# Patient Record
Sex: Female | Born: 1955
Health system: Southern US, Community
[De-identification: ages and names within clinical notes are randomized; demographics above are authoritative.]

## PROBLEM LIST (undated history)

## (undated) DIAGNOSIS — A499 Bacterial infection, unspecified: Secondary | ICD-10-CM

## (undated) DIAGNOSIS — B019 Varicella without complication: Secondary | ICD-10-CM

## (undated) DIAGNOSIS — D649 Anemia, unspecified: Secondary | ICD-10-CM

## (undated) DIAGNOSIS — N390 Urinary tract infection, site not specified: Secondary | ICD-10-CM

## (undated) HISTORY — DX: Bacterial infection, unspecified: A49.9

## (undated) HISTORY — DX: Urinary tract infection, site not specified: N39.0

## (undated) HISTORY — DX: Anemia, unspecified: D64.9

## (undated) HISTORY — DX: Varicella without complication: B01.9

---
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
## 2007-11-03 LAB — IFOBT (OCCULT BLOOD): IFOBT: NEGATIVE

## 2008-11-09 LAB — IFOBT (OCCULT BLOOD): IFOBT: NEGATIVE

## 2010-11-21 LAB — IFOBT (OCCULT BLOOD): IFOBT: NEGATIVE

## 2010-12-13 LAB — HM MAMMOGRAPHY

## 2011-11-25 LAB — HM PAP SMEAR: HM Pap smear: NORMAL

## 2012-09-12 ENCOUNTER — Ambulatory Visit (INDEPENDENT_AMBULATORY_CARE_PROVIDER_SITE_OTHER): Payer: BC Managed Care – PPO | Admitting: Nurse Practitioner

## 2012-09-12 ENCOUNTER — Encounter: Payer: Self-pay | Admitting: Nurse Practitioner

## 2012-09-12 VITALS — BP 104/58 | HR 80 | Temp 98.3°F | Ht 66.5 in | Wt 140.2 lb

## 2012-09-12 DIAGNOSIS — Z13220 Encounter for screening for lipoid disorders: Secondary | ICD-10-CM

## 2012-09-12 DIAGNOSIS — Z130 Encounter for screening for diseases of the blood and blood-forming organs and certain disorders involving the immune mechanism: Secondary | ICD-10-CM

## 2012-09-12 DIAGNOSIS — Z1211 Encounter for screening for malignant neoplasm of colon: Secondary | ICD-10-CM

## 2012-09-12 DIAGNOSIS — Z1321 Encounter for screening for nutritional disorder: Secondary | ICD-10-CM

## 2012-09-12 DIAGNOSIS — Z23 Encounter for immunization: Secondary | ICD-10-CM

## 2012-09-12 LAB — LIPID PANEL
Cholesterol: 249 mg/dL — ABNORMAL HIGH (ref 0–200)
Total CHOL/HDL Ratio: 3
VLDL: 19.6 mg/dL (ref 0.0–40.0)

## 2012-09-12 MED ORDER — TETANUS-DIPHTH-ACELL PERTUSSIS 5-2.5-18.5 LF-MCG/0.5 IM SUSP
0.5000 mL | Freq: Once | INTRAMUSCULAR | Status: AC
  Administered 2012-09-12: 0.5 mL via INTRAMUSCULAR

## 2012-09-12 NOTE — Patient Instructions (Addendum)
You look great today! I recommend 81 mg enteric coated aspirin daily for stroke prevention. Also continue with daily fish oil. I will call you if any labs are abnormal, requiring treatment. Sign up for "[facility]" so you can look at labs at home & send emails to me. Unless you are sick, we will see you in 1 year. Please schedule nurse visit for flu vaccine in September. Pleasure to meet you!

## 2012-09-12 NOTE — Progress Notes (Signed)
Subjective:     Sheryl Morrison is a 57 y.o. female and is here for a comprehensive physical exam. The patient reports occasional constipation, managed with metamucil; has received yearly PAP, mammograms, & colon screens-guiac with normal results..  History   Social History  . Marital Status: Married    Spouse Name: N/A    Number of Children: 1  . Years of Education: 12   Occupational History  . Not on file.   Social History Main Topics  . Smoking status: Former Games developer  . Smokeless tobacco: Never Used  . Alcohol Use: No  . Drug Use: No  . Sexually Active: Not on file   Other Topics Concern  . Not on file   Social History Narrative  . No narrative on file   No health maintenance topics applied.  The following portions of the patient's history were reviewed and updated as appropriate: allergies, current medications, past family history, past medical history, past social history, past surgical history and problem list.  Review of Systems A comprehensive review of systems was negative except for: Eyes: positive for contacts/glasses Gastrointestinal: positive for constipation Behavioral/Psych: positive for anxiety , states "nerves" cause constipation at times.  Objective:    BP 104/58  Pulse 80  Temp(Src) 98.3 F (36.8 C) (Oral)  Ht 5' 6.5" (1.689 m)  Wt 140 lb 4 oz (63.617 kg)  BMI 22.3 kg/m2  SpO2 97% General appearance: alert, cooperative, appears stated age and no distress Head: Normocephalic, without obvious abnormality, atraumatic Eyes: conjunctivae/corneas clear. PERRL, EOM's intact. Fundi benign. Ears: external & canal N bilat. Clear effusion R TM, L TM N Nose: Nares normal. Septum midline. Mucosa normal. No drainage or sinus tenderness. Throat: lips, mucosa, and tongue normal; teeth and gums normal Neck: no adenopathy, no carotid bruit, no JVD, supple, symmetrical, trachea midline and thyroid not enlarged, symmetric, no tenderness/mass/nodules Back: symmetric, no  curvature. ROM normal. No CVA tenderness. Lungs: clear to auscultation bilaterally Heart: regular rate and rhythm, S1, S2 normal, no murmur, click, rub or gallop Abdomen: soft, non-tender; bowel sounds normal; no masses,  no organomegaly Extremities: extremities normal, atraumatic, no cyanosis or edema Pulses: 2+ and symmetric Skin: Skin color, texture, turgor normal. No rashes or lesions Lymph nodes: Cervical, supraclavicular, and axillary nodes normal.    Assessment:   Need for diphtheria-tetanus-pertussis (Tdap) vaccine, adult/adolescent - Plan: Tdap vaccine greater than or equal to 7yo IM  Screening cholesterol level - Plan: Lipid panel  Encounter for vitamin deficiency screening - Plan: Vitamin D 25 hydroxy  Colon cancer screening - Plan: Fecal occult blood, imunochemical  Occasional constipation   Plan:   1. Tdap administered today 2.Lipid panel today. Pt NOT fasting. 3. Taking Ca supplements, screen for therapeutic Vit D level for best Ca absorption 4. Ifob sent w/pt today See After Visit Summary for Counseling Recommendations

## 2012-09-13 LAB — LDL CHOLESTEROL, DIRECT: Direct LDL: 156.4 mg/dL

## 2012-09-13 NOTE — Addendum Note (Signed)
Addended by: Baldemar Lenis R on: 09/13/2012 02:08 PM   Modules accepted: Orders

## 2012-09-18 ENCOUNTER — Other Ambulatory Visit (INDEPENDENT_AMBULATORY_CARE_PROVIDER_SITE_OTHER): Payer: BC Managed Care – PPO

## 2012-09-18 DIAGNOSIS — Z1211 Encounter for screening for malignant neoplasm of colon: Secondary | ICD-10-CM

## 2012-09-18 LAB — FECAL OCCULT BLOOD, IMMUNOCHEMICAL: Fecal Occult Bld: NEGATIVE

## 2012-09-19 ENCOUNTER — Encounter: Payer: Self-pay | Admitting: *Deleted

## 2012-09-19 LAB — OCCULT BLOOD X 1 CARD TO LAB, STOOL: Occult Blood (~~LOC~~): NEGATIVE

## 2012-09-19 LAB — CYTOLOGY - PAP
Pap Smear: NEGATIVE
Pap: NEGATIVE

## 2012-10-15 ENCOUNTER — Encounter: Payer: Self-pay | Admitting: Nurse Practitioner

## 2013-09-12 ENCOUNTER — Encounter: Payer: Self-pay | Admitting: Internal Medicine

## 2013-09-12 ENCOUNTER — Ambulatory Visit (INDEPENDENT_AMBULATORY_CARE_PROVIDER_SITE_OTHER): Payer: BC Managed Care – PPO | Admitting: Nurse Practitioner

## 2013-09-12 ENCOUNTER — Encounter: Payer: Self-pay | Admitting: Nurse Practitioner

## 2013-09-12 VITALS — BP 107/68 | HR 72 | Temp 97.4°F | Ht 66.5 in | Wt 140.0 lb

## 2013-09-12 DIAGNOSIS — Z1211 Encounter for screening for malignant neoplasm of colon: Secondary | ICD-10-CM

## 2013-09-12 DIAGNOSIS — Z1239 Encounter for other screening for malignant neoplasm of breast: Secondary | ICD-10-CM

## 2013-09-12 DIAGNOSIS — Z0000 Encounter for general adult medical examination without abnormal findings: Secondary | ICD-10-CM

## 2013-09-12 LAB — CBC WITH DIFFERENTIAL/PLATELET
BASOS ABS: 0 10*3/uL (ref 0.0–0.1)
Basophils Relative: 0.3 % (ref 0.0–3.0)
Eosinophils Absolute: 0.2 10*3/uL (ref 0.0–0.7)
Eosinophils Relative: 3.3 % (ref 0.0–5.0)
HEMATOCRIT: 37.9 % (ref 36.0–46.0)
Hemoglobin: 13 g/dL (ref 12.0–15.0)
LYMPHS ABS: 1.3 10*3/uL (ref 0.7–4.0)
Lymphocytes Relative: 27.5 % (ref 12.0–46.0)
MCHC: 34.2 g/dL (ref 30.0–36.0)
MCV: 88.5 fl (ref 78.0–100.0)
MONO ABS: 0.4 10*3/uL (ref 0.1–1.0)
Monocytes Relative: 7.6 % (ref 3.0–12.0)
Neutro Abs: 3 10*3/uL (ref 1.4–7.7)
Neutrophils Relative %: 61.3 % (ref 43.0–77.0)
PLATELETS: 295 10*3/uL (ref 150.0–400.0)
RBC: 4.29 Mil/uL (ref 3.87–5.11)
RDW: 13.8 % (ref 11.5–15.5)
WBC: 4.9 10*3/uL (ref 4.0–10.5)

## 2013-09-12 LAB — COMPREHENSIVE METABOLIC PANEL
ALK PHOS: 64 U/L (ref 39–117)
ALT: 16 U/L (ref 0–35)
AST: 28 U/L (ref 0–37)
Albumin: 4.4 g/dL (ref 3.5–5.2)
BILIRUBIN TOTAL: 1.1 mg/dL (ref 0.2–1.2)
BUN: 12 mg/dL (ref 6–23)
CO2: 30 mEq/L (ref 19–32)
Calcium: 10 mg/dL (ref 8.4–10.5)
Chloride: 103 mEq/L (ref 96–112)
Creatinine, Ser: 0.8 mg/dL (ref 0.4–1.2)
GFR: 81.5 mL/min (ref >60.00)
GLUCOSE: 85 mg/dL (ref 70–99)
Potassium: 4.3 mEq/L (ref 3.5–5.1)
SODIUM: 140 meq/L (ref 135–145)
TOTAL PROTEIN: 7.2 g/dL (ref 6.0–8.3)

## 2013-09-12 LAB — URINALYSIS, MICROSCOPIC ONLY

## 2013-09-12 LAB — LIPID PANEL
CHOLESTEROL: 278 mg/dL — AB (ref 0–200)
HDL: 84.1 mg/dL (ref >39.00)
LDL CALC: 178 mg/dL — AB (ref 0–99)
Total CHOL/HDL Ratio: 3
Triglycerides: 78 mg/dL (ref 0.0–149.0)
VLDL: 15.6 mg/dL (ref 0.0–40.0)

## 2013-09-12 NOTE — Progress Notes (Signed)
Pre visit review using our clinic review tool, if applicable. No additional management support is needed unless otherwise documented below in the visit note. 

## 2013-09-12 NOTE — Patient Instructions (Signed)
Our office will call you with lab results and any necessary follow up. Great work with regular exercise! The benefits include weight loss, lower risk for heart disease, diabetes, stroke, high blood pressure, lower rates of depression & dementia, better sleep quality & bone health. Eat more plant foods than animal foods, including dairy, for protection against heart disease and cancer. Please get mammogram and colonoscopy. You will need Pap & pelvic exam next year. You may have that done here or with gynecology-I go to [facility]. Pleasure to meet you!  Preventive Care for Adults, Female A healthy lifestyle and preventive care can promote health and wellness. Preventive health guidelines for women include the following key practices.  A routine yearly physical is a good way to check with your caregiver about your health and preventive screening. It is a chance to share any concerns and updates on your health, and to receive a thorough exam.  Visit your dentist for a routine exam and preventive care every 6 months. Brush your teeth twice a day and floss once a day. Good oral hygiene prevents tooth decay and gum disease.  The frequency of eye exams is based on your age, health, family medical history, use of contact lenses, and other factors. Follow your caregiver's recommendations for frequency of eye exams.  Eat a healthy diet. Foods like vegetables, fruits, whole grains, low-fat dairy products, and lean protein foods contain the nutrients you need without too many calories. Decrease your intake of foods high in solid fats, added sugars, and salt. Eat the right amount of calories for you.Get information about a proper diet from your caregiver, if necessary.  Regular physical exercise is one of the most important things you can do for your health. Most adults should get at least 150 minutes of moderate-intensity exercise (any activity that increases your heart rate and causes you to sweat) each  week. In addition, most adults need muscle-strengthening exercises on 2 or more days a week.  Maintain a healthy weight. The body mass index (BMI) is a screening tool to identify possible weight problems. It provides an estimate of body fat based on height and weight. Your caregiver can help determine your BMI, and can help you achieve or maintain a healthy weight.For adults 20 years and older:  A BMI below 18.5 is considered underweight.  A BMI of 18.5 to 24.9 is normal.  A BMI of 25 to 29.9 is considered overweight.  A BMI of 30 and above is considered obese.  Maintain normal blood lipids and cholesterol levels by exercising and minimizing your intake of saturated fat. Eat a balanced diet with plenty of fruit and vegetables. Blood tests for lipids and cholesterol should begin at age 32 and be repeated every 5 years. If your lipid or cholesterol levels are high, you are over 50, or you are at high risk for heart disease, you may need your cholesterol levels checked more frequently.Ongoing high lipid and cholesterol levels should be treated with medicines if diet and exercise are not effective.  If you smoke, find out from your caregiver how to quit. If you do not use tobacco, do not start.  Lung cancer screening is recommended for adults aged 86 80 years who are at high risk for developing lung cancer because of a history of smoking. Yearly low-dose computed tomography (CT) is recommended for people who have at least a 30-pack-year history of smoking and are a current smoker or have quit within the past 15 years. A pack  year of smoking is smoking an average of 1 pack of cigarettes a day for 1 year (for example: 1 pack a day for 30 years or 2 packs a day for 15 years). Yearly screening should continue until the smoker has stopped smoking for at least 15 years. Yearly screening should also be stopped for people who develop a health problem that would prevent them from having lung cancer  treatment.  If you are pregnant, do not drink alcohol. If you are breastfeeding, be very cautious about drinking alcohol. If you are not pregnant and choose to drink alcohol, do not exceed 1 drink per day. One drink is considered to be 12 ounces (355 mL) of beer, 5 ounces (148 mL) of wine, or 1.5 ounces (44 mL) of liquor.  Avoid use of street drugs. Do not share needles with anyone. Ask for help if you need support or instructions about stopping the use of drugs.  High blood pressure causes heart disease and increases the risk of stroke. Your blood pressure should be checked at least every 1 to 2 years. Ongoing high blood pressure should be treated with medicines if weight loss and exercise are not effective.  If you are 90 to 59 years old, ask your caregiver if you should take aspirin to prevent strokes.  Diabetes screening involves taking a blood sample to check your fasting blood sugar level. This should be done once every 3 years, after age 71, if you are within normal weight and without risk factors for diabetes. Testing should be considered at a younger age or be carried out more frequently if you are overweight and have at least 1 risk factor for diabetes.  Breast cancer screening is essential preventive care for women. You should practice "breast self-awareness." This means understanding the normal appearance and feel of your breasts and may include breast self-examination. Any changes detected, no matter how small, should be reported to a caregiver. Women in their 51s and 30s should have a clinical breast exam (CBE) by a caregiver as part of a regular health exam every 1 to 3 years. After age 68, women should have a CBE every year. Starting at age 82, women should consider having a mammography (breast X-ray test) every year. Women who have a family history of breast cancer should talk to their caregiver about genetic screening. Women at a high risk of breast cancer should talk to their  caregivers about having magnetic resonance imaging (MRI) and a mammography every year.  Breast cancer gene (BRCA)-related cancer risk assessment is recommended for women who have family members with BRCA-related cancers. BRCA-related cancers include breast, ovarian, tubal, and peritoneal cancers. Having family members with these cancers may be associated with an increased risk for harmful changes (mutations) in the breast cancer genes BRCA1 and BRCA2. Results of the assessment will determine the need for genetic counseling and BRCA1 and BRCA2 testing.  The Pap test is a screening test for cervical cancer. A Pap test can show cell changes on the cervix that might become cervical cancer if left untreated. A Pap test is a procedure in which cells are obtained and examined from the lower end of the uterus (cervix).  Women should have a Pap test starting at age 35.  Between ages 42 and 78, Pap tests should be repeated every 2 years.  Beginning at age 57, you should have a Pap test every 3 years as long as the past 3 Pap tests have been normal.  Some women have  medical problems that increase the chance of getting cervical cancer. Talk to your caregiver about these problems. It is especially important to talk to your caregiver if a new problem develops soon after your last Pap test. In these cases, your caregiver may recommend more frequent screening and Pap tests.  The above recommendations are the same for women who have or have not gotten the vaccine for human papillomavirus (HPV).  If you had a hysterectomy for a problem that was not cancer or a condition that could lead to cancer, then you no longer need Pap tests. Even if you no longer need a Pap test, a regular exam is a good idea to make sure no other problems are starting.  If you are between ages 23 and 55, and you have had normal Pap tests going back 10 years, you no longer need Pap tests. Even if you no longer need a Pap test, a regular exam  is a good idea to make sure no other problems are starting.  If you have had past treatment for cervical cancer or a condition that could lead to cancer, you need Pap tests and screening for cancer for at least 20 years after your treatment.  If Pap tests have been discontinued, risk factors (such as a new sexual partner) need to be reassessed to determine if screening should be resumed.  The HPV test is an additional test that may be used for cervical cancer screening. The HPV test looks for the virus that can cause the cell changes on the cervix. The cells collected during the Pap test can be tested for HPV. The HPV test could be used to screen women aged 46 years and older, and should be used in women of any age who have unclear Pap test results. After the age of 61, women should have HPV testing at the same frequency as a Pap test.  Colorectal cancer can be detected and often prevented. Most routine colorectal cancer screening begins at the age of 71 and continues through age 41. However, your caregiver may recommend screening at an earlier age if you have risk factors for colon cancer. On a yearly basis, your caregiver may provide home test kits to check for hidden blood in the stool. Use of a small camera at the end of a tube, to directly examine the colon (sigmoidoscopy or colonoscopy), can detect the earliest forms of colorectal cancer. Talk to your caregiver about this at age 43, when routine screening begins. Direct examination of the colon should be repeated every 5 to 10 years through age 51, unless early forms of pre-cancerous polyps or small growths are found.  Hepatitis C blood testing is recommended for all people born from [birth year] through 1965 and any individual with known risks for hepatitis C.  Practice safe sex. Use condoms and avoid high-risk sexual practices to reduce the spread of sexually transmitted infections (STIs). STIs include gonorrhea, chlamydia, syphilis, trichomonas,  herpes, HPV, and human immunodeficiency virus (HIV). Herpes, HIV, and HPV are viral illnesses that have no cure. They can result in disability, cancer, and death. Sexually active women aged 25 and younger should be checked for chlamydia. Older women with new or multiple partners should also be tested for chlamydia. Testing for other STIs is recommended if you are sexually active and at increased risk.  Osteoporosis is a disease in which the bones lose minerals and strength with aging. This can result in serious bone fractures. The risk of osteoporosis can be identified  using a bone density scan. Women ages 1 and over and women at risk for fractures or osteoporosis should discuss screening with their caregivers. Ask your caregiver whether you should take a calcium supplement or vitamin D to reduce the rate of osteoporosis.  Menopause can be associated with physical symptoms and risks. Hormone replacement therapy is available to decrease symptoms and risks. You should talk to your caregiver about whether hormone replacement therapy is right for you.  Use sunscreen. Apply sunscreen liberally and repeatedly throughout the day. You should seek shade when your shadow is shorter than you. Protect yourself by wearing long sleeves, pants, a wide-brimmed hat, and sunglasses year round, whenever you are outdoors.  Once a month, do a whole body skin exam, using a mirror to look at the skin on your back. Notify your caregiver of new moles, moles that have irregular borders, moles that are larger than a pencil eraser, or moles that have changed in shape or color.  Stay current with required immunizations.  Influenza vaccine. All adults should be immunized every year.  Tetanus, diphtheria, and acellular pertussis (Td, Tdap) vaccine. Pregnant women should receive 1 dose of Tdap vaccine during each pregnancy. The dose should be obtained regardless of the length of time since the last dose. Immunization is preferred  during the 27th to 36th week of gestation. An adult who has not previously received Tdap or who does not know her vaccine status should receive 1 dose of Tdap. This initial dose should be followed by tetanus and diphtheria toxoids (Td) booster doses every 10 years. Adults with an unknown or incomplete history of completing a 3-dose immunization series with Td-containing vaccines should begin or complete a primary immunization series including a Tdap dose. Adults should receive a Td booster every 10 years.  Varicella vaccine. An adult without evidence of immunity to varicella should receive 2 doses or a second dose if she has previously received 1 dose. Pregnant females who do not have evidence of immunity should receive the first dose after pregnancy. This first dose should be obtained before leaving the health care facility. The second dose should be obtained 4 8 weeks after the first dose.  Human papillomavirus (HPV) vaccine. Females aged 75 26 years who have not received the vaccine previously should obtain the 3-dose series. The vaccine is not recommended for use in pregnant females. However, pregnancy testing is not needed before receiving a dose. If a female is found to be pregnant after receiving a dose, no treatment is needed. In that case, the remaining doses should be delayed until after the pregnancy. Immunization is recommended for any person with an immunocompromised condition through the age of 40 years if she did not get any or all doses earlier. During the 3-dose series, the second dose should be obtained 4 8 weeks after the first dose. The third dose should be obtained 24 weeks after the first dose and 16 weeks after the second dose.  Zoster vaccine. One dose is recommended for adults aged 29 years or older unless certain conditions are present.  Measles, mumps, and rubella (MMR) vaccine. Adults born before 1 generally are considered immune to measles and mumps. Adults born in [birth year] or  later should have 1 or more doses of MMR vaccine unless there is a contraindication to the vaccine or there is laboratory evidence of immunity to each of the three diseases. A routine second dose of MMR vaccine should be obtained at least 28 days after the first dose  for students attending postsecondary schools, health care workers, or international travelers. People who received inactivated measles vaccine or an unknown type of measles vaccine during 1963 1967 should receive 2 doses of MMR vaccine. People who received inactivated mumps vaccine or an unknown type of mumps vaccine before 1979 and are at high risk for mumps infection should consider immunization with 2 doses of MMR vaccine. For females of childbearing age, rubella immunity should be determined. If there is no evidence of immunity, females who are not pregnant should be vaccinated. If there is no evidence of immunity, females who are pregnant should delay immunization until after pregnancy. Unvaccinated health care workers born before [birth year] who lack laboratory evidence of measles, mumps, or rubella immunity or laboratory confirmation of disease should consider measles and mumps immunization with 2 doses of MMR vaccine or rubella immunization with 1 dose of MMR vaccine.  Pneumococcal 13-valent conjugate (PCV13) vaccine. When indicated, a person who is uncertain of her immunization history and has no record of immunization should receive the PCV13 vaccine. An adult aged 39 years or older who has certain medical conditions and has not been previously immunized should receive 1 dose of PCV13 vaccine. This PCV13 should be followed with a dose of pneumococcal polysaccharide (PPSV23) vaccine. The PPSV23 vaccine dose should be obtained at least 8 weeks after the dose of PCV13 vaccine. An adult aged 42 years or older who has certain medical conditions and previously received 1 or more doses of PPSV23 vaccine should receive 1 dose of PCV13. The PCV13 vaccine  dose should be obtained 1 or more years after the last PPSV23 vaccine dose.  Pneumococcal polysaccharide (PPSV23) vaccine. When PCV13 is also indicated, PCV13 should be obtained first. All adults aged 33 years and older should be immunized. An adult younger than age 52 years who has certain medical conditions should be immunized. Any person who resides in a nursing home or long-term care facility should be immunized. An adult smoker should be immunized. People with an immunocompromised condition and certain other conditions should receive both PCV13 and PPSV23 vaccines. People with human immunodeficiency virus (HIV) infection should be immunized as soon as possible after diagnosis. Immunization during chemotherapy or radiation therapy should be avoided. Routine use of PPSV23 vaccine is not recommended for American Indians, [place] Natives, or people younger than 65 years unless there are medical conditions that require PPSV23 vaccine. When indicated, people who have unknown immunization and have no record of immunization should receive PPSV23 vaccine. One-time revaccination 5 years after the first dose of PPSV23 is recommended for people aged 57 64 years who have chronic kidney failure, nephrotic syndrome, asplenia, or immunocompromised conditions. People who received 1 2 doses of PPSV23 before age 12 years should receive another dose of PPSV23 vaccine at age 68 years or later if at least 5 years have passed since the previous dose. Doses of PPSV23 are not needed for people immunized with PPSV23 at or after age 40 years.  Meningococcal vaccine. Adults with asplenia or persistent complement component deficiencies should receive 2 doses of quadrivalent meningococcal conjugate (MenACWY-D) vaccine. The doses should be obtained at least 2 months apart. Microbiologists working with certain meningococcal bacteria, [employer] recruits, people at risk during an outbreak, and people who travel to or live in countries with a  high rate of meningitis should be immunized. A first-year college student up through age 18 years who is living in a residence hall should receive a dose if she did not receive a dose  on or after her 16th birthday. Adults who have certain high-risk conditions should receive one or more doses of vaccine.  Hepatitis A vaccine. Adults who wish to be protected from this disease, have certain high-risk conditions, work with hepatitis A-infected animals, work in hepatitis A research labs, or travel to or work in countries with a high rate of hepatitis A should be immunized. Adults who were previously unvaccinated and who anticipate close contact with an international adoptee during the first 60 days after arrival in the [place] from a country with a high rate of hepatitis A should be immunized.  Hepatitis B vaccine. Adults who wish to be protected from this disease, have certain high-risk conditions, may be exposed to blood or other infectious body fluids, are household contacts or sex partners of hepatitis B positive people, are clients or workers in certain care facilities, or travel to or work in countries with a high rate of hepatitis B should be immunized.  Haemophilus influenzae type b (Hib) vaccine. A previously unvaccinated person with asplenia or sickle cell disease or having a scheduled splenectomy should receive 1 dose of Hib vaccine. Regardless of previous immunization, a recipient of a hematopoietic stem cell transplant should receive a 3-dose series 6 12 months after her successful transplant. Hib vaccine is not recommended for adults with HIV infection. Preventive Services / Frequency Ages 29 to 94  Blood pressure check.** / Every 1 to 2 years.  Lipid and cholesterol check.** / Every 5 years beginning at age 40.  Lung cancer screening. / Every year if you are aged 21 80 years and have a 30-pack-year history of smoking and currently smoke or have quit within the past 15 years. Yearly  screening is stopped once you have quit smoking for at least 15 years or develop a health problem that would prevent you from having lung cancer treatment.  Clinical breast exam.** / Every year after age 59.  BRCA-related cancer risk assessment.** / For women who have family members with a BRCA-related cancer (breast, ovarian, tubal, or peritoneal cancers).  Mammogram.** / Every year beginning at age 99 and continuing for as long as you are in good health. Consult with your caregiver.  Pap test.** / Every 3 years starting at age 61 through age 21 or 71 with a history of 3 consecutive normal Pap tests.  HPV screening.** / Every 3 years from ages 25 through ages 46 to 60 with a history of 3 consecutive normal Pap tests.  Fecal occult blood test (FOBT) of stool. / Every year beginning at age 90 and continuing until age 34. You may not need to do this test if you get a colonoscopy every 10 years.  Flexible sigmoidoscopy or colonoscopy.** / Every 5 years for a flexible sigmoidoscopy or every 10 years for a colonoscopy beginning at age 49 and continuing until age 59.  Hepatitis C blood test.** / For all people born from [birth year] through 1965 and any individual with known risks for hepatitis C.  Skin self-exam. / Monthly.  Influenza vaccine. / Every year.  Tetanus, diphtheria, and acellular pertussis (Tdap/Td) vaccine.** / Consult your caregiver. Pregnant women should receive 1 dose of Tdap vaccine during each pregnancy. 1 dose of Td every 10 years.  Varicella vaccine.** / Consult your caregiver. Pregnant females who do not have evidence of immunity should receive the first dose after pregnancy.  Zoster vaccine.** / 1 dose for adults aged 21 years or older.  Measles, mumps, rubella (MMR) vaccine.** / You  need at least 1 dose of MMR if you were born in 1957 or later. You may also need a 2nd dose. For females of childbearing age, rubella immunity should be determined. If there is no evidence of  immunity, females who are not pregnant should be vaccinated. If there is no evidence of immunity, females who are pregnant should delay immunization until after pregnancy.  Pneumococcal 13-valent conjugate (PCV13) vaccine.** / Consult your caregiver.  Pneumococcal polysaccharide (PPSV23) vaccine.** / 1 to 2 doses if you smoke cigarettes or if you have certain conditions.  Meningococcal vaccine.** / Consult your caregiver.  Hepatitis A vaccine.** / Consult your caregiver.  Hepatitis B vaccine.** / Consult your caregiver.  Haemophilus influenzae type b (Hib) vaccine.** / Consult your caregiver. ** Family history and personal history of risk and conditions may change your caregiver's recommendations. Document Released: 05/31/2001 Document Revised: 07/30/2012 Document Reviewed: 08/30/2010 [facility] Patient Information 2014 [place], [place].

## 2013-09-12 NOTE — Assessment & Plan Note (Signed)
Vaccines UTD. Discussed shingles vaccine. She has had 1 episode of shingles. Last MMG Nml 2012. Will order. Never had colonoscopy, Ifob neg. Discussed recommendations for colon cancer screening. Pt wishes to have colonoscopy. Will order. Pap 2013 Nml. No abmnl. Next pap 2016.

## 2013-09-12 NOTE — Progress Notes (Signed)
Subjective:     Trayana Boley is a 58 y.o. female and is here for a comprehensive physical exam. The patient reports no problems. She exercises daily and eats a diet high in vegetable intake as well as some meats. She has never had a colonoscopy, but ifobs have been negative for occult blood. She is due for mammogram. Up to date on vaccines.   History   Social History  . Marital Status: Married    Spouse Name: N/A    Number of Children: 1  . Years of Education: 12   Occupational History  . retired    Social History Main Topics  . Smoking status: Former Games developer  . Smokeless tobacco: Never Used  . Alcohol Use: 1.8 oz/week    3 Cans of beer per week     Comment: 2-4 beers on weekends  . Drug Use: No  . Sexual Activity: Yes    Birth Control/ Protection: Post-menopausal   Other Topics Concern  . Not on file   Social History Narrative   Lives with husband, recently relocated from [place]. Daughter lives in [place]. No grandchildren.   Health Maintenance  Topic Date Due  . Colonoscopy  07/11/2004  . Influenza Vaccine  11/16/2013  . Mammogram  09/20/2014  . Pap Smear  12/14/2014  . Tetanus/tdap  09/13/2022    The following portions of the patient's history were reviewed and updated as appropriate: allergies, current medications, past family history, past medical history, past social history, past surgical history and problem list.  Review of Systems Pertinent items are noted in HPI.   Objective:    BP 107/68  Pulse 72  Temp(Src) 97.4 F (36.3 C) (Temporal)  Ht 5' 6.5" (1.689 m)  Wt 140 lb (63.504 kg)  BMI 22.26 kg/m2  SpO2 99% General appearance: alert, cooperative, appears stated age and no distress Head: Normocephalic, without obvious abnormality, atraumatic Eyes: negative findings: lids and lashes normal, conjunctivae and sclerae normal, corneas clear and pupils equal, round, reactive to light and accomodation Ears: normal TM's and external ear canals both ears Throat: lips,  mucosa, and tongue normal; teeth and gums normal Lungs: clear to auscultation bilaterally Breasts: normal appearance, no masses or tenderness Heart: regular rate and rhythm, S1, S2 normal, no murmur, click, rub or gallop Abdomen: soft, non-tender; bowel sounds normal; no masses,  no organomegaly Extremities: extremities normal, atraumatic, no cyanosis or edema Pulses: 2+ and symmetric Skin: Skin color, texture, turgor normal. No rashes or lesions Lymph nodes: Cervical, supraclavicular, and axillary nodes normal. Neurologic: Grossly normal    Assessment:    Healthy female exam.      Plan:    Schedule MMG, colonoscopy.Labs. See After Visit Summary for Counseling Recommendations

## 2013-09-13 ENCOUNTER — Telehealth: Payer: Self-pay | Admitting: Nurse Practitioner

## 2013-09-13 LAB — VITAMIN D 25 HYDROXY (VIT D DEFICIENCY, FRACTURES): Vit D, 25-Hydroxy: 81 ng/mL (ref 30–89)

## 2013-09-13 NOTE — Telephone Encounter (Signed)
LMOVM for pt to return call 

## 2013-09-13 NOTE — Telephone Encounter (Signed)
Vit D getting too high, will recommend decreasing supplement by half. No statin indicated: 10 yr risk for ASCVD is low 2% All other labs look great! See her in 1 year.

## 2013-09-13 NOTE — Telephone Encounter (Signed)
Patient notified of results.

## 2013-09-23 ENCOUNTER — Ambulatory Visit
Admission: RE | Admit: 2013-09-23 | Discharge: 2013-09-23 | Disposition: A | Discharge status: Home / Self Care | Payer: BC Managed Care – PPO | Source: Ambulatory Visit | Attending: Nurse Practitioner | Admitting: Nurse Practitioner

## 2013-09-23 DIAGNOSIS — Z0000 Encounter for general adult medical examination without abnormal findings: Secondary | ICD-10-CM

## 2013-09-23 DIAGNOSIS — Z1239 Encounter for other screening for malignant neoplasm of breast: Secondary | ICD-10-CM

## 2013-09-25 ENCOUNTER — Encounter: Payer: Self-pay | Admitting: Nurse Practitioner

## 2013-11-25 ENCOUNTER — Encounter: Payer: Self-pay | Admitting: Internal Medicine

## 2013-11-25 ENCOUNTER — Ambulatory Visit (AMBULATORY_SURGERY_CENTER): Payer: Self-pay

## 2013-11-25 VITALS — Ht 66.5 in | Wt 145.8 lb

## 2013-11-25 DIAGNOSIS — Z1211 Encounter for screening for malignant neoplasm of colon: Secondary | ICD-10-CM

## 2013-11-25 MED ORDER — MOVIPREP 100 G PO SOLR: 1.0000 | Freq: Once | ORAL | Status: DC

## 2013-11-25 NOTE — Progress Notes (Signed)
No allergies to eggs or soy No past problems with anesthesia No home oxygen No diet/weight loss meds  Has email  Emmi instructions given for colonoscopy 

## 2013-12-09 ENCOUNTER — Encounter: Payer: Self-pay | Admitting: Internal Medicine

## 2013-12-09 ENCOUNTER — Ambulatory Visit (AMBULATORY_SURGERY_CENTER): Payer: BC Managed Care – PPO | Admitting: Internal Medicine

## 2013-12-09 VITALS — BP 113/76 | HR 74 | Temp 96.9°F | Resp 19

## 2013-12-09 DIAGNOSIS — Z1211 Encounter for screening for malignant neoplasm of colon: Secondary | ICD-10-CM

## 2013-12-09 MED ORDER — SODIUM CHLORIDE 0.9 % IV SOLN: 500.0000 mL | INTRAVENOUS | Status: DC

## 2013-12-09 NOTE — Op Note (Signed)
[facility] [address] [place], [address]   COLONOSCOPY PROCEDURE REPORT  PATIENT: Sheryl Morrison, Sheryl Morrison  MR#: [id number] BIRTHDATE: July 24, 1955 , 58  yrs. old GENDER: Female ENDOSCOPIST: Beverley Fiedler, MD REFERRED DG:LOVFI Weaver, FNP PROCEDURE DATE:  12/09/2013 PROCEDURE:   Colonoscopy, screening First Screening Colonoscopy - Avg.  risk and is 50 yrs.  old or older Yes.  Prior Negative Screening - Now for repeat screening. N/A  History of Adenoma - Now for follow-up colonoscopy & has been > or = to 3 yrs.  N/A  Polyps Removed Today? No.  Recommend repeat exam, <10 yrs? No. ASA CLASS:   Class II INDICATIONS:average risk screening and first colonoscopy. MEDICATIONS: MAC sedation, administered by CRNA and propofol (Diprivan)  IV  DESCRIPTION OF PROCEDURE:   After the risks benefits and alternatives of the procedure were thoroughly explained, informed consent was obtained.  A digital rectal exam revealed external hemorrhoids.   The LB [id number]  endoscope was introduced through the anus and advanced to the cecum, which was identified by both the appendix and ileocecal valve. No adverse events experienced.   The quality of the prep was good, using MoviPrep The instrument was then slowly withdrawn as the colon was fully examined.   COLON FINDINGS: There was mild scattered diverticulosis noted at the hepatic flexure, in the descending colon, and sigmoid colon.   The colon was otherwise normal.  There was no inflammation, polyps or cancers seen.  Retroflexed views revealed no abnormalities. The time to cecum=7 minutes 36 seconds.  Withdrawal time=11 minutes 22 seconds.  The scope was withdrawn and the procedure completed. COMPLICATIONS: There were no complications.  ENDOSCOPIC IMPRESSION: 1.   There was mild diverticulosis noted at the hepatic flexure, in the descending colon, and sigmoid colon 2.   The colon was otherwise normal  RECOMMENDATIONS: 1.   High fiber diet 2.  You should continue to follow colorectal cancer screening guidelines for "routine risk" patients with a repeat colonoscopy in 10 years.  There is no need for FOBT (stool) testing for at least 5 years.   eSigned:  Beverley Fiedler, MD 12/09/2013 9:13 AM   cc: The Patient; Maximino Sarin, FNP

## 2013-12-09 NOTE — Progress Notes (Signed)
Called to room to assist during endoscopic procedure.  Patient ID and intended procedure confirmed with present staff. Received instructions for my participation in the procedure from the performing physician.  

## 2013-12-09 NOTE — Progress Notes (Signed)
Report to PACU, RN, vss, BBS= Clear.  

## 2013-12-09 NOTE — Patient Instructions (Signed)
YOU HAD AN ENDOSCOPIC PROCEDURE TODAY AT THE [facility]: Refer to the procedure report that was given to you for any specific questions about what was found during the examination.  If the procedure report does not answer your questions, please call your gastroenterologist to clarify.  If you requested that your care partner not be given the details of your procedure findings, then the procedure report has been included in a sealed envelope for you to review at your convenience later.  YOU SHOULD EXPECT: Some feelings of bloating in the abdomen. Passage of more gas than usual.  Walking can help get rid of the air that was put into your GI tract during the procedure and reduce the bloating. If you had a lower endoscopy (such as a colonoscopy or flexible sigmoidoscopy) you may notice spotting of blood in your stool or on the toilet paper. If you underwent a bowel prep for your procedure, then you may not have a normal bowel movement for a few days.  DIET: Your first meal following the procedure should be a light meal and then it is ok to progress to your normal diet.  A half-sandwich or bowl of soup is an example of a good first meal.  Heavy or fried foods are harder to digest and may make you feel nauseous or bloated.  Likewise meals heavy in dairy and vegetables can cause extra gas to form and this can also increase the bloating.  Drink plenty of fluids but you should avoid alcoholic beverages for 24 hours.  ACTIVITY: Your care partner should take you home directly after the procedure.  You should plan to take it easy, moving slowly for the rest of the day.  You can resume normal activity the day after the procedure however you should NOT DRIVE or use heavy machinery for 24 hours (because of the sedation medicines used during the test).    SYMPTOMS TO REPORT IMMEDIATELY: A gastroenterologist can be reached at any hour.  During normal business hours, 8:30 AM to 5:00 PM Monday through Friday,  call [phone].  After hours and on weekends, please call the GI answering service at [phone] who will take a message and have the physician on call contact you.   Following lower endoscopy (colonoscopy or flexible sigmoidoscopy):  Excessive amounts of blood in the stool  Significant tenderness or worsening of abdominal pains  Swelling of the abdomen that is new, acute  Fever of 100F or higher    FOLLOW UP: If any biopsies were taken you will be contacted by phone or by letter within the next 1-3 weeks.  Call your gastroenterologist if you have not heard about the biopsies in 3 weeks.  Our staff will call the home number listed on your records the next business day following your procedure to check on you and address any questions or concerns that you may have at that time regarding the information given to you following your procedure. This is a courtesy call and so if there is no answer at the home number and we have not heard from you through the emergency physician on call, we will assume that you have returned to your regular daily activities without incident.  SIGNATURES/CONFIDENTIALITY: You and/or your care partner have signed paperwork which will be entered into your electronic medical record.  These signatures attest to the fact that that the information above on your After Visit Summary has been reviewed and is understood.  Full responsibility of the confidentiality   of this discharge information lies with you and/or your care-partner.  Diverticulosis, and high fiber diet informtion given.

## 2013-12-10 ENCOUNTER — Telehealth: Payer: Self-pay | Admitting: *Deleted

## 2013-12-10 NOTE — Telephone Encounter (Signed)
Message left

## 2013-12-12 ENCOUNTER — Encounter: Payer: Self-pay | Admitting: Nurse Practitioner

## 2014-09-16 ENCOUNTER — Encounter: Payer: Self-pay | Admitting: Nurse Practitioner

## 2014-09-16 ENCOUNTER — Ambulatory Visit (INDEPENDENT_AMBULATORY_CARE_PROVIDER_SITE_OTHER): Payer: BLUE CROSS/BLUE SHIELD | Admitting: Nurse Practitioner

## 2014-09-16 VITALS — BP 126/87 | HR 78 | Temp 97.9°F | Ht 66.5 in | Wt 146.0 lb

## 2014-09-16 DIAGNOSIS — K5730 Diverticulosis of large intestine without perforation or abscess without bleeding: Secondary | ICD-10-CM | POA: Insufficient documentation

## 2014-09-16 DIAGNOSIS — Z114 Encounter for screening for human immunodeficiency virus [HIV]: Secondary | ICD-10-CM

## 2014-09-16 DIAGNOSIS — Z0000 Encounter for general adult medical examination without abnormal findings: Secondary | ICD-10-CM | POA: Diagnosis not present

## 2014-09-16 DIAGNOSIS — L989 Disorder of the skin and subcutaneous tissue, unspecified: Secondary | ICD-10-CM | POA: Diagnosis not present

## 2014-09-16 LAB — COMPREHENSIVE METABOLIC PANEL
ALT: 25 U/L (ref 0–35)
AST: 25 U/L (ref 0–37)
Albumin: 4.5 g/dL (ref 3.5–5.2)
Alkaline Phosphatase: 72 U/L (ref 39–117)
BUN: 15 mg/dL (ref 6–23)
CO2: 30 meq/L (ref 19–32)
CREATININE: 0.75 mg/dL (ref 0.40–1.20)
Calcium: 9.9 mg/dL (ref 8.4–10.5)
Chloride: 103 mEq/L (ref 96–112)
GFR: 83.73 mL/min (ref >60.00)
Glucose, Bld: 92 mg/dL (ref 70–99)
Potassium: 5.1 mEq/L (ref 3.5–5.1)
Sodium: 139 mEq/L (ref 135–145)
Total Bilirubin: 0.9 mg/dL (ref 0.2–1.2)
Total Protein: 7.2 g/dL (ref 6.0–8.3)

## 2014-09-16 LAB — CBC WITH DIFFERENTIAL/PLATELET
Basophils Absolute: 0 10*3/uL (ref 0.0–0.1)
Basophils Relative: 0.5 % (ref 0.0–3.0)
Eosinophils Absolute: 0.2 10*3/uL (ref 0.0–0.7)
Eosinophils Relative: 3.2 % (ref 0.0–5.0)
HCT: 38.3 % (ref 36.0–46.0)
HEMOGLOBIN: 12.8 g/dL (ref 12.0–15.0)
LYMPHS PCT: 27.3 % (ref 12.0–46.0)
Lymphs Abs: 1.5 10*3/uL (ref 0.7–4.0)
MCHC: 33.3 g/dL (ref 30.0–36.0)
MCV: 89.4 fl (ref 78.0–100.0)
MONOS PCT: 7.5 % (ref 3.0–12.0)
Monocytes Absolute: 0.4 10*3/uL (ref 0.1–1.0)
NEUTROS ABS: 3.4 10*3/uL (ref 1.4–7.7)
NEUTROS PCT: 61.5 % (ref 43.0–77.0)
Platelets: 259 10*3/uL (ref 150.0–400.0)
RBC: 4.29 Mil/uL (ref 3.87–5.11)
RDW: 14.1 % (ref 11.5–15.5)
WBC: 5.5 10*3/uL (ref 4.0–10.5)

## 2014-09-16 LAB — LIPID PANEL
CHOLESTEROL: 266 mg/dL — AB (ref 0–200)
HDL: 79.5 mg/dL (ref >39.00)
LDL Cholesterol: 164 mg/dL — ABNORMAL HIGH (ref 0–99)
NonHDL: 186.5
Total CHOL/HDL Ratio: 3
Triglycerides: 114 mg/dL (ref 0.0–149.0)
VLDL: 22.8 mg/dL (ref 0.0–40.0)

## 2014-09-16 LAB — TSH: TSH: 1.45 u[IU]/mL (ref 0.35–4.50)

## 2014-09-16 LAB — VITAMIN D 25 HYDROXY (VIT D DEFICIENCY, FRACTURES): VITD: 35.21 ng/mL (ref 30.00–100.00)

## 2014-09-16 NOTE — Patient Instructions (Signed)
Thank you for taking such great care of yourself!  My office will call with lab results.  Continue with lifelong habits of exercise most days of the week: take a 30 minute walk. The benefits include weight loss, lower risk for heart disease, diabetes, stroke, high blood pressure, lower rates of depression & dementia, better sleep quality & bone health.  Continue to cut out refined sugar: anything that is sweet when you eat or drink it, except fresh fruit. Cut out refined grains: white bread, rolls, biscuits, bagels, muffins, pasta and cereals. Choose grains with 4 gm or more of fiber per serving.   Limit alcoholic beverages to no more than 1 drink daily.   Plan for pelvic exam & mammogram next year, stool test in 4 years and colonoscopy in 9 years.  Great to see you!

## 2014-09-16 NOTE — Progress Notes (Signed)
Pre visit review using our clinic review tool, if applicable. No additional management support is needed unless otherwise documented below in the visit note. 

## 2014-09-16 NOTE — Progress Notes (Signed)
Subjective:     Sheryl Morrison is a 59 y.o. female and is here for a comprehensive physical exam. The patient reports no problems.  She is exercising daily and eats a diet high in fiber, fresh vegetables & fruit.  MMG 2015 was normal. Last Pap 2013, Nml. No abnormals. Declines Pap today, will plan for next yr.  Colon cancer screening: colonoscopy 2015 was neg for polpys, she had some diverticuli. Recmd to repeat ifobt 2020, colonoscopy 2025. Vaccines UTD. Had shingles outbreak, declines vaccine.   History   Social History  . Marital Status: Married    Spouse Name: N/A  . Number of Children: 1  . Years of Education: 12   Occupational History  . retired    Social History Main Topics  . Smoking status: Former Games developermoker  . Smokeless tobacco: Never Used  . Alcohol Use: 1.8 oz/week    3 Cans of beer per week     Comment: 2-4 beers on weekends  . Drug Use: No  . Sexual Activity: Yes    Birth Control/ Protection: Post-menopausal   Other Topics Concern  . Not on file   Social History Narrative   Lives with husband, recently relocated from [place]. Daughter lives in [place]. No grandchildren.   Health Maintenance  Topic Date Due  . ZOSTAVAX  09/16/2015 (Originally 07/12/2014)  . INFLUENZA VACCINE  11/17/2014  . PAP SMEAR  12/14/2014  . MAMMOGRAM  09/24/2015  . TETANUS/TDAP  09/13/2022  . COLONOSCOPY  12/10/2023  . HIV Screening  Completed    The following portions of the patient's history were reviewed and updated as appropriate: allergies, current medications, past family history, past medical history, past social history, past surgical history and problem list.  Review of Systems Constitutional: negative for fatigue and fevers Eyes: negative for visual disturbance Ears, nose, mouth, throat, and face: positive for nasal congestion Respiratory: negative for cough Cardiovascular: negative for chest pressure/discomfort, irregular heart beat and lower extremity edema Gastrointestinal:  negative for abdominal pain, change in bowel habits, constipation, diarrhea and dyspepsia Genitourinary:negative for postmenopausal bleeding Integument/breast: positive for skin lesion(s) Musculoskeletal:negative for back pain and myalgias Allergic/Immunologic: positive for hay fever   Objective:    BP 126/87 mmHg  Pulse 78  Temp(Src) 97.9 F (36.6 C) (Oral)  Ht 5' 6.5" (1.689 m)  Wt 146 lb (66.225 kg)  BMI 23.21 kg/m2  SpO2 100% General appearance: alert, cooperative, appears stated age and no distress Head: Normocephalic, without obvious abnormality, atraumatic Eyes: negative findings: lids and lashes normal, conjunctivae and sclerae normal, corneas clear and pupils equal, round, reactive to light and accomodation Ears: normal TM's and external ear canals both ears Throat: lips, mucosa, and tongue normal; teeth and gums normal Neck: no adenopathy, no carotid bruit, supple, symmetrical, trachea midline and thyroid not enlarged, symmetric, no tenderness/mass/nodules Lungs: clear to auscultation bilaterally Heart: regular rate and rhythm, S1, S2 normal, no murmur, click, rub or gallop Abdomen: soft, non-tender; bowel sounds normal; no masses,  no organomegaly Pulses: 2+ and symmetric Skin: L chest. 5mm pink raised lesion, few telangiectasia, fine scale Lymph nodes: Cervical, supraclavicular, and axillary nodes normal. Neurologic: Grossly normal    Assessment:plan  1. Preventative health care Continue daily exercise & healthy diet - CBC with Differential/Platelet - Comprehensive metabolic panel - Lipid panel - HIV antibody - TSH - Vit D  25 hydroxy (rtn osteoporosis monitoring)  2. Encounter for screening for HIV - HIV antibody  3. Chest skin lesion Telangiectasia, scale - Ambulatory referral  to Dermatology  F/u 1 yr-pelvic

## 2014-09-17 ENCOUNTER — Telehealth: Payer: Self-pay | Admitting: Nurse Practitioner

## 2014-09-17 DIAGNOSIS — E559 Vitamin D deficiency, unspecified: Secondary | ICD-10-CM

## 2014-09-17 LAB — HIV ANTIBODY (ROUTINE TESTING W REFLEX): HIV 1&2 Ab, 4th Generation: NONREACTIVE

## 2014-09-17 NOTE — Telephone Encounter (Signed)
LMOVM-identified about lab results. Patient to call back with any questions or concerns. DPR Signed.  

## 2014-09-17 NOTE — Telephone Encounter (Signed)
pls call pt: Advise Labs look super! Vit D can be bumped up to 4000 iu D3 qd. Pls Sched lab appt in 3 mos to check level.

## 2015-09-15 ENCOUNTER — Encounter: Payer: Self-pay | Admitting: Family Medicine

## 2015-09-15 ENCOUNTER — Other Ambulatory Visit (HOSPITAL_COMMUNITY)
Admission: RE | Admit: 2015-09-15 | Discharge: 2015-09-15 | Disposition: A | Discharge status: Home / Self Care | Payer: BLUE CROSS/BLUE SHIELD | Source: Ambulatory Visit | Attending: Family Medicine | Admitting: Family Medicine

## 2015-09-15 ENCOUNTER — Ambulatory Visit (INDEPENDENT_AMBULATORY_CARE_PROVIDER_SITE_OTHER): Payer: BLUE CROSS/BLUE SHIELD | Admitting: Family Medicine

## 2015-09-15 VITALS — BP 113/75 | HR 65 | Temp 98.0°F | Resp 20 | Ht 67.5 in | Wt 140.0 lb

## 2015-09-15 DIAGNOSIS — Z0000 Encounter for general adult medical examination without abnormal findings: Secondary | ICD-10-CM | POA: Diagnosis not present

## 2015-09-15 DIAGNOSIS — Z130 Encounter for screening for diseases of the blood and blood-forming organs and certain disorders involving the immune mechanism: Secondary | ICD-10-CM | POA: Diagnosis not present

## 2015-09-15 DIAGNOSIS — Z01419 Encounter for gynecological examination (general) (routine) without abnormal findings: Secondary | ICD-10-CM | POA: Diagnosis present

## 2015-09-15 DIAGNOSIS — E785 Hyperlipidemia, unspecified: Secondary | ICD-10-CM | POA: Insufficient documentation

## 2015-09-15 DIAGNOSIS — E559 Vitamin D deficiency, unspecified: Secondary | ICD-10-CM | POA: Diagnosis not present

## 2015-09-15 DIAGNOSIS — Z1329 Encounter for screening for other suspected endocrine disorder: Secondary | ICD-10-CM

## 2015-09-15 DIAGNOSIS — E2839 Other primary ovarian failure: Secondary | ICD-10-CM

## 2015-09-15 DIAGNOSIS — Z1159 Encounter for screening for other viral diseases: Secondary | ICD-10-CM

## 2015-09-15 DIAGNOSIS — Z124 Encounter for screening for malignant neoplasm of cervix: Secondary | ICD-10-CM

## 2015-09-15 DIAGNOSIS — Z1239 Encounter for other screening for malignant neoplasm of breast: Secondary | ICD-10-CM

## 2015-09-15 DIAGNOSIS — Z1151 Encounter for screening for human papillomavirus (HPV): Secondary | ICD-10-CM | POA: Insufficient documentation

## 2015-09-15 LAB — LIPID PANEL
CHOLESTEROL: 262 mg/dL — AB (ref 0–200)
HDL: 81.6 mg/dL (ref >39.00)
LDL Cholesterol: 161 mg/dL — ABNORMAL HIGH (ref 0–99)
NonHDL: 180.15
TRIGLYCERIDES: 98 mg/dL (ref 0.0–149.0)
Total CHOL/HDL Ratio: 3
VLDL: 19.6 mg/dL (ref 0.0–40.0)

## 2015-09-15 LAB — COMPREHENSIVE METABOLIC PANEL
ALBUMIN: 4.7 g/dL (ref 3.5–5.2)
ALT: 13 U/L (ref 0–35)
AST: 19 U/L (ref 0–37)
Alkaline Phosphatase: 70 U/L (ref 39–117)
BILIRUBIN TOTAL: 0.6 mg/dL (ref 0.2–1.2)
BUN: 17 mg/dL (ref 6–23)
CALCIUM: 9.8 mg/dL (ref 8.4–10.5)
CHLORIDE: 104 meq/L (ref 96–112)
CO2: 29 mEq/L (ref 19–32)
CREATININE: 0.72 mg/dL (ref 0.40–1.20)
GFR: 87.47 mL/min (ref >60.00)
Glucose, Bld: 95 mg/dL (ref 70–99)
Potassium: 4.8 mEq/L (ref 3.5–5.1)
Sodium: 139 mEq/L (ref 135–145)
Total Protein: 7.1 g/dL (ref 6.0–8.3)

## 2015-09-15 LAB — CBC WITH DIFFERENTIAL/PLATELET
BASOS ABS: 0 10*3/uL (ref 0.0–0.1)
Basophils Relative: 0.8 % (ref 0.0–3.0)
EOS PCT: 4.3 % (ref 0.0–5.0)
Eosinophils Absolute: 0.2 10*3/uL (ref 0.0–0.7)
HEMATOCRIT: 38 % (ref 36.0–46.0)
HEMOGLOBIN: 12.7 g/dL (ref 12.0–15.0)
LYMPHS ABS: 1.6 10*3/uL (ref 0.7–4.0)
Lymphocytes Relative: 29.8 % (ref 12.0–46.0)
MCHC: 33.3 g/dL (ref 30.0–36.0)
MCV: 87.4 fl (ref 78.0–100.0)
MONO ABS: 0.4 10*3/uL (ref 0.1–1.0)
Monocytes Relative: 8.4 % (ref 3.0–12.0)
Neutro Abs: 3 10*3/uL (ref 1.4–7.7)
Neutrophils Relative %: 56.7 % (ref 43.0–77.0)
Platelets: 283 10*3/uL (ref 150.0–400.0)
RBC: 4.35 Mil/uL (ref 3.87–5.11)
RDW: 14.5 % (ref 11.5–15.5)
WBC: 5.3 10*3/uL (ref 4.0–10.5)

## 2015-09-15 LAB — VITAMIN D 25 HYDROXY (VIT D DEFICIENCY, FRACTURES): VITD: 55.71 ng/mL (ref 30.00–100.00)

## 2015-09-15 LAB — TSH: TSH: 1.75 u[IU]/mL (ref 0.35–4.50)

## 2015-09-15 MED ORDER — ZOSTER VACCINE LIVE 19400 UNT/0.65ML ~~LOC~~ SUSR: 0.6500 mL | Freq: Once | SUBCUTANEOUS | Status: DC

## 2015-09-15 NOTE — Progress Notes (Addendum)
Patient ID: Sheryl Morrison, female   DOB: [birth year], 60 y.o.   MRN: [id number]      Patient ID: Sheryl Morrison, female  DOB: 04/10/1956, 60 y.o.   MRN: [id number]  Subjective:  Sheryl Morrison is a 60 y.o. female present for annual visit with PAP. All past medical history, surgical history, allergies, family history, immunizations, medications and social history were obtained/updated in the electronic medical record today. All recent labs, ED visits and hospitalizations within the last year were reviewed. Pt has no complaints today. Pt has no complaints today.  Patient Care Team    Relationship Specialty Notifications Start End  Natalia Leatherwood, DO PCP - General Family Medicine  09/15/15   Beverley Fiedler, MD Consulting Physician Gastroenterology  09/15/15     Health maintenance:  Colonoscopy: 2015, normal (diverticulosis only), no FHX colon cancer.  Mammogram: 09/2013; normal birads 1, no fhx. Ordered today. Cervical cancer screening: 2013 PAP, normal. PAP with HPV co-test completed today.  Immunizations: zostavax ordered today.  Infectious disease screening: HIV completed, Hep C ordered today.  DEXA: ordered today to be scheduled with mammogram.  Assistive device: None  Oxygen NWG:NFAO  Patient has a Dental home. Hospitalizations/ED visits: None  Depression screen [facility] 2/9 09/15/2015  Decreased Interest 0  Down, Depressed, Hopeless 0  PHQ - 2 Score 0    Fall Risk  09/15/2015  Falls in the past year? No   Current Exercise Habits: Home exercise routine, Type of exercise: walking;Other - see comments (biking), Time (Minutes): 60, Frequency (Times/Week): 7, Weekly Exercise (Minutes/Week): 420, Intensity: Moderate     Past Medical History  Diagnosis Date  . Anemia   . Chicken pox   . Urinary tract bacterial infections    No Known Allergies Past Surgical History  Procedure Laterality Date  . Appendectomy  1963   Family History  Problem Relation Age of Onset  . Heart disease Mother   .  Hypertension Mother   . Obesity Mother   . Heart disease Father   . Hypertension Father   . Alcohol abuse Sister   . Depression Daughter   . Obesity Daughter   . Alzheimer's disease Paternal Uncle   . Colon cancer Neg Hx   . Pancreatic cancer Neg Hx   . Rectal cancer Neg Hx   . Stomach cancer Neg Hx    Social History   Social History  . Marital Status: Married    Spouse Name: N/A  . Number of Children: 1  . Years of Education: 12   Occupational History  . retired    Social History Main Topics  . Smoking status: Former Games developer  . Smokeless tobacco: Never Used  . Alcohol Use: 1.8 oz/week    3 Cans of beer per week     Comment: 2-4 beers on weekends  . Drug Use: No  . Sexual Activity: Yes    Birth Control/ Protection: Post-menopausal   Other Topics Concern  . Not on file   Social History Narrative   Lives with husband. She has 1 Daughter.    Retired. Librarian  at a school.    Wears her seatbelt.    Smoke detector int he home.    Wear sunscreen.    Takes a vitamin.    Ambulated independently.    Feels safe in relationships         ROS: Negative, with the exception of above mentioned in HPI  Objective: BP 113/75 mmHg  Pulse 65  Temp(Src) 98  F (36.7 C) (Oral)  Resp 20  Ht 5' 7.5" (1.715 m)  Wt 140 lb (63.504 kg)  BMI 21.59 kg/m2  SpO2 97% Gen: Afebrile. No acute distress. Nontoxic in appearance, well-developed, well-nourished, female, pleasant HENT: AT. [place]. Bilateral TM visualized and normal in appearance, normal external auditory canal. MMM, no oral lesions, Good  dentition. Bilateral nares without erythema or swelling . Throat without erythema, ulcerations or exudates. No  Cough on exam, No  hoarseness on exam. Eyes:Pupils Equal Round Reactive to light, Extraocular movements intact,  Conjunctiva without redness, discharge or icterus. Neck/lymp/endocrine: Supple, no lymphadenopathy, no thyromegaly CV: RRR no murmur appreciated, no edema, +2/4 P posterior  tibialis pulses. no carotid bruits. No JVD. Chest: CTAB, no wheeze, rhonchi or crackles. Normal Respiratory effort. Good Air movement. Abd: Soft. flat. NTND. BS present  No Masses palpated. No hepatosplenomegaly. No rebound tenderness or guarding. Skin: No  rashes, purpura or petechiae. Warm and well-perfused. Skin intact. Neuro/Msk: Normal gait. PERLA. EOMi. Alert. Oriented x3.  Cranial nerves II through XII intact. Muscle strength 5/5 UE/LE extremity. DTRs equal bilaterally. Psych: Normal affect, dress and demeanor. Normal speech. Normal thought content and judgment. Breasts: breasts appear normal, symmetrical, no tenderness on exam, no suspicious masses, no skin or nipple changes or axillary nodes. GYN:  External genitalia within normal limits, normal hair distribution, no lesions. Urethral meatus normal, no lesions. Vaginal mucosa pink, moist, normal rugae, no lesions. No cystocele or rectocele. cervix without lesions, no discharge,   Bimanual exam revealed normal uterus.  No bladder/suprapubic fullness, masses or tenderness. No cervical motion tenderness. No adnexal fullness. Anus and perineum within normal limits, no lesions.   Assessment/plan: Sheryl Morrison is a 60 y.o. female present for annual exam.  1. Vitamin D deficiency - Continue current supplement. Recollect today to make certain adequate levels.  - VITAMIN D 25 Hydroxy (Vit-D Deficiency, Fractures)  2. Screening, anemia, deficiency, iron - CBC w/Diff  3. Thyroid disorder screen - TSH  4. Hyperlipidemia - Lipid panel - Patient currently taking fish oil supplementation, family history of heart disease is present in both of her parents. Stressed exercise/diet and risk factor control. - diet and exercise counseling provided.   5. Need for hepatitis C screening test - Pt amendable to infectious disease screening.  - Hepatitis C Antibody  6. Pap smear for cervical cancer screening - Cytology - PAP with HPV cotest  7.  Encounter for preventive health examination - CBC w/Diff - Comprehensive metabolic panel - Lipid panel - TSH - VITAMIN D 25 Hydroxy (Vit-D Deficiency, Fractures) - Zoster Vaccine Live, PF, (ZOSTAVAX) 16109 UNT/0.65ML injection; Inject 19,400 Units into the skin once.  Dispense: 1 each; Refill: 0 - Hepatitis C Antibody - Cytology - PAP Colonoscopy: 2015, normal (diverticulosis only), no FHX colon cancer.  Mammogram: 09/2013; normal birads 1, no fhx. Ordered today. Cervical cancer screening: 2013 PAP, normal. PAP with HPV co-test completed today.  Immunizations: zostavax ordered today.  Infectious disease screening: HIV completed, Hep C ordered today.  DEXA: ordered today to be scheduled with mammogram.  Patient was encouraged to exercise greater than 150 minutes a week. Patient was encouraged to choose a diet filled with fresh fruits and vegetables, and lean meats. AVS provided to patient today for education/recommendation on gender specific health and safety maintenance.  8. Breast cancer screening - mammogram ordered today to schedule with DEXA.   9. Estrogen deficiency - DEXA ordered with mammogram.     Return in about 1 year (around 09/14/2016)  for CPE.  Electronically signed by: Felix Pacinienee Kuneff, DO [facility]

## 2015-09-15 NOTE — Patient Instructions (Signed)
Health Maintenance, Female Adopting a healthy lifestyle and getting preventive care can go a long way to promote health and wellness. Talk with your health care provider about what schedule of regular examinations is right for you. This is a good chance for you to check in with your provider about disease prevention and staying healthy. In between checkups, there are plenty of things you can do on your own. Experts have done a lot of research about which lifestyle changes and preventive measures are most likely to keep you healthy. Ask your health care provider for more information. WEIGHT AND DIET  Eat a healthy diet  Be sure to include plenty of vegetables, fruits, low-fat dairy products, and lean protein.  Do not eat a lot of foods high in solid fats, added sugars, or salt.  Get regular exercise. This is one of the most important things you can do for your health.  Most adults should exercise for at least 150 minutes each week. The exercise should increase your heart rate and make you sweat (moderate-intensity exercise).  Most adults should also do strengthening exercises at least twice a week. This is in addition to the moderate-intensity exercise.  Maintain a healthy weight  Body mass index (BMI) is a measurement that can be used to identify possible weight problems. It estimates body fat based on height and weight. Your health care provider can help determine your BMI and help you achieve or maintain a healthy weight.  For females 20 years of age and older:   A BMI below 18.5 is considered underweight.  A BMI of 18.5 to 24.9 is normal.  A BMI of 25 to 29.9 is considered overweight.  A BMI of 30 and above is considered obese.  Watch levels of cholesterol and blood lipids  You should start having your blood tested for lipids and cholesterol at 61 years of age, then have this test every 5 years.  You may need to have your cholesterol levels checked more often if:  Your lipid  or cholesterol levels are high.  You are older than 61 years of age.  You are at high risk for heart disease.  CANCER SCREENING   Lung Cancer  Lung cancer screening is recommended for adults 55-80 years old who are at high risk for lung cancer because of a history of smoking.  A yearly low-dose CT scan of the lungs is recommended for people who:  Currently smoke.  Have quit within the past 15 years.  Have at least a 30-pack-year history of smoking. A pack year is smoking an average of one pack of cigarettes a day for 1 year.  Yearly screening should continue until it has been 15 years since you quit.  Yearly screening should stop if you develop a health problem that would prevent you from having lung cancer treatment.  Breast Cancer  Practice breast self-awareness. This means understanding how your breasts normally appear and feel.  It also means doing regular breast self-exams. Let your health care provider know about any changes, no matter how small.  If you are in your 20s or 30s, you should have a clinical breast exam (CBE) by a health care provider every 1-3 years as part of a regular health exam.  If you are 40 or older, have a CBE every year. Also consider having a breast X-ray (mammogram) every year.  If you have a family history of breast cancer, talk to your health care provider about genetic screening.  If you   are at high risk for breast cancer, talk to your health care provider about having an MRI and a mammogram every year.  Breast cancer gene (BRCA) assessment is recommended for women who have family members with BRCA-related cancers. BRCA-related cancers include:  Breast.  Ovarian.  Tubal.  Peritoneal cancers.  Results of the assessment will determine the need for genetic counseling and BRCA1 and BRCA2 testing. Cervical Cancer Your health care provider may recommend that you be screened regularly for cancer of the pelvic organs (ovaries, uterus, and  vagina). This screening involves a pelvic examination, including checking for microscopic changes to the surface of your cervix (Pap test). You may be encouraged to have this screening done every 3 years, beginning at age 21.  For women ages 30-65, health care providers may recommend pelvic exams and Pap testing every 3 years, or they may recommend the Pap and pelvic exam, combined with testing for human papilloma virus (HPV), every 5 years. Some types of HPV increase your risk of cervical cancer. Testing for HPV may also be done on women of any age with unclear Pap test results.  Other health care providers may not recommend any screening for nonpregnant women who are considered low risk for pelvic cancer and who do not have symptoms. Ask your health care provider if a screening pelvic exam is right for you.  If you have had past treatment for cervical cancer or a condition that could lead to cancer, you need Pap tests and screening for cancer for at least 20 years after your treatment. If Pap tests have been discontinued, your risk factors (such as having a new sexual partner) need to be reassessed to determine if screening should resume. Some women have medical problems that increase the chance of getting cervical cancer. In these cases, your health care provider may recommend more frequent screening and Pap tests. Colorectal Cancer  This type of cancer can be detected and often prevented.  Routine colorectal cancer screening usually begins at 61 years of age and continues through 61 years of age.  Your health care provider may recommend screening at an earlier age if you have risk factors for colon cancer.  Your health care provider may also recommend using home test kits to check for hidden blood in the stool.  A small camera at the end of a tube can be used to examine your colon directly (sigmoidoscopy or colonoscopy). This is done to check for the earliest forms of colorectal  cancer.  Routine screening usually begins at age 50.  Direct examination of the colon should be repeated every 5-10 years through 61 years of age. However, you may need to be screened more often if early forms of precancerous polyps or small growths are found. Skin Cancer  Check your skin from head to toe regularly.  Tell your health care provider about any new moles or changes in moles, especially if there is a change in a mole's shape or color.  Also tell your health care provider if you have a mole that is larger than the size of a pencil eraser.  Always use sunscreen. Apply sunscreen liberally and repeatedly throughout the day.  Protect yourself by wearing long sleeves, pants, a wide-brimmed hat, and sunglasses whenever you are outside. HEART DISEASE, DIABETES, AND HIGH BLOOD PRESSURE   High blood pressure causes heart disease and increases the risk of stroke. High blood pressure is more likely to develop in:  People who have blood pressure in the high end   of the normal range (130-139/85-89 mm Hg).  People who are overweight or obese.  People who are African American.  If you are 38-23 years of age, have your blood pressure checked every 3-5 years. If you are 61 years of age or older, have your blood pressure checked every year. You should have your blood pressure measured twice--once when you are at a hospital or clinic, and once when you are not at a hospital or clinic. Record the average of the two measurements. To check your blood pressure when you are not at a hospital or clinic, you can use:  An automated blood pressure machine at a pharmacy.  A home blood pressure monitor.  If you are between 45 years and 39 years old, ask your health care provider if you should take aspirin to prevent strokes.  Have regular diabetes screenings. This involves taking a blood sample to check your fasting blood sugar level.  If you are at a normal weight and have a low risk for diabetes,  have this test once every three years after 61 years of age.  If you are overweight and have a high risk for diabetes, consider being tested at a younger age or more often. PREVENTING INFECTION  Hepatitis B  If you have a higher risk for hepatitis B, you should be screened for this virus. You are considered at high risk for hepatitis B if:  You were born in a country where hepatitis B is common. Ask your health care provider which countries are considered high risk.  Your parents were born in a high-risk country, and you have not been immunized against hepatitis B (hepatitis B vaccine).  You have HIV or AIDS.  You use needles to inject street drugs.  You live with someone who has hepatitis B.  You have had sex with someone who has hepatitis B.  You get hemodialysis treatment.  You take certain medicines for conditions, including cancer, organ transplantation, and autoimmune conditions. Hepatitis C  Blood testing is recommended for:  Everyone born from [birth year] through 1966.  Anyone with known risk factors for hepatitis C. Sexually transmitted infections (STIs)  You should be screened for sexually transmitted infections (STIs) including gonorrhea and chlamydia if:  You are sexually active and are younger than 61 years of age.  You are older than 61 years of age and your health care provider tells you that you are at risk for this type of infection.  Your sexual activity has changed since you were last screened and you are at an increased risk for chlamydia or gonorrhea. Ask your health care provider if you are at risk.  If you do not have HIV, but are at risk, it may be recommended that you take a prescription medicine daily to prevent HIV infection. This is called pre-exposure prophylaxis (PrEP). You are considered at risk if:  You are sexually active and do not regularly use condoms or know the HIV status of your partner(s).  You take drugs by injection.  You are sexually  active with a partner who has HIV. Talk with your health care provider about whether you are at high risk of being infected with HIV. If you choose to begin PrEP, you should first be tested for HIV. You should then be tested every 3 months for as long as you are taking PrEP.  PREGNANCY   If you are premenopausal and you may become pregnant, ask your health care provider about preconception counseling.  If you may  become pregnant, take 400 to 800 micrograms (mcg) of folic acid every day.  If you want to prevent pregnancy, talk to your health care provider about birth control (contraception). OSTEOPOROSIS AND MENOPAUSE   Osteoporosis is a disease in which the bones lose minerals and strength with aging. This can result in serious bone fractures. Your risk for osteoporosis can be identified using a bone density scan.  If you are 61 years of age or older, or if you are at risk for osteoporosis and fractures, ask your health care provider if you should be screened.  Ask your health care provider whether you should take a calcium or vitamin D supplement to lower your risk for osteoporosis.  Menopause may have certain physical symptoms and risks.  Hormone replacement therapy may reduce some of these symptoms and risks. Talk to your health care provider about whether hormone replacement therapy is right for you.  HOME CARE INSTRUCTIONS   Schedule regular health, dental, and eye exams.  Stay current with your immunizations.   Do not use any tobacco products including cigarettes, chewing tobacco, or electronic cigarettes.  If you are pregnant, do not drink alcohol.  If you are breastfeeding, limit how much and how often you drink alcohol.  Limit alcohol intake to no more than 1 drink per day for nonpregnant women. One drink equals 12 ounces of beer, 5 ounces of wine, or 1 ounces of hard liquor.  Do not use street drugs.  Do not share needles.  Ask your health care provider for help if  you need support or information about quitting drugs.  Tell your health care provider if you often feel depressed.  Tell your health care provider if you have ever been abused or do not feel safe at home.   This information is not intended to replace advice given to you by your health care provider. Make sure you discuss any questions you have with your health care provider.   Document Released: 10/18/2010 Document Revised: 04/25/2014 Document Reviewed: 03/06/2013 Elsevier Interactive Patient Education [employer].

## 2015-09-16 ENCOUNTER — Telehealth: Payer: Self-pay | Admitting: Family Medicine

## 2015-09-16 LAB — CYTOLOGY - PAP

## 2015-09-16 LAB — HEPATITIS C ANTIBODY: HCV Ab: NEGATIVE

## 2015-09-16 NOTE — Telephone Encounter (Signed)
Spoke with patient reviewed lab results and instructions. Patient verbalized understanding. 

## 2015-09-16 NOTE — Telephone Encounter (Signed)
Please call pt: - Pts labs all look good, with the exception of her cholesterol.  - Her cholesterol is still higher than desired and about the same as last year. She has reported taking 1000 mg fish oil daily, I would encourage her to take 2000-3000 mg fish oil nightly (since she did not want to start a statin). - she could take less Vitamin D as well. Her Vitamin D is >50, 30-49 is more desired. She could just take away one days dose of Vit D, and that should be fine.   Results for orders placed or performed in visit on 09/15/15 (from the past 72 hour(s))  CBC w/Diff     Status: None   Collection Time: 09/15/15  8:55 AM  Result Value Ref Range   WBC 5.3 4.0 - 10.5 K/uL   RBC 4.35 3.87 - 5.11 Mil/uL   Hemoglobin 12.7 12.0 - 15.0 g/dL   HCT 27.238.0 53.636.0 - 64.446.0 %   MCV 87.4 78.0 - 100.0 fl   MCHC 33.3 30.0 - 36.0 g/dL   RDW 03.414.5 74.211.5 - 59.515.5 %   Platelets 283.0 150.0 - 400.0 K/uL   Neutrophils Relative % 56.7 43.0 - 77.0 %   Lymphocytes Relative 29.8 12.0 - 46.0 %   Monocytes Relative 8.4 3.0 - 12.0 %   Eosinophils Relative 4.3 0.0 - 5.0 %   Basophils Relative 0.8 0.0 - 3.0 %   Neutro Abs 3.0 1.4 - 7.7 K/uL   Lymphs Abs 1.6 0.7 - 4.0 K/uL   Monocytes Absolute 0.4 0.1 - 1.0 K/uL   Eosinophils Absolute 0.2 0.0 - 0.7 K/uL   Basophils Absolute 0.0 0.0 - 0.1 K/uL  Comprehensive metabolic panel     Status: None   Collection Time: 09/15/15  8:55 AM  Result Value Ref Range   Sodium 139 135 - 145 mEq/L   Potassium 4.8 3.5 - 5.1 mEq/L   Chloride 104 96 - 112 mEq/L   CO2 29 19 - 32 mEq/L   Glucose, Bld 95 70 - 99 mg/dL   BUN 17 6 - 23 mg/dL   Creatinine, Ser 6.380.72 0.40 - 1.20 mg/dL   Total Bilirubin 0.6 0.2 - 1.2 mg/dL   Alkaline Phosphatase 70 39 - 117 U/L   AST 19 0 - 37 U/L   ALT 13 0 - 35 U/L   Total Protein 7.1 6.0 - 8.3 g/dL   Albumin 4.7 3.5 - 5.2 g/dL   Calcium 9.8 8.4 - 75.610.5 mg/dL   GFR 43.3287.47 >95.18>60.00 mL/min  Lipid panel     Status: Abnormal   Collection Time: 09/15/15  8:55 AM   Result Value Ref Range   Cholesterol 262 (H) 0 - 200 mg/dL    Comment: ATP III Classification       Desirable:  < 200 mg/dL               Borderline High:  200 - 239 mg/dL          High:  > = 841240 mg/dL   Triglycerides 66.098.0 0.0 - 149.0 mg/dL    Comment: Normal:  <630<150 mg/dLBorderline High:  150 - 199 mg/dL   HDL 16.0181.60 >09.32>39.00 mg/dL   VLDL 35.519.6 0.0 - 73.240.0 mg/dL   LDL Cholesterol 202161 (H) 0 - 99 mg/dL   Total CHOL/HDL Ratio 3     Comment:                Men  Women1/2 Average Risk     3.4          3.3Average Risk          5.0          4.42X Average Risk          9.6          7.13X Average Risk          15.0          11.0                       NonHDL 180.15     Comment: NOTE:  Non-HDL goal should be 30 mg/dL higher than patient's LDL goal (i.e. LDL goal of < 70 mg/dL, would have non-HDL goal of < 100 mg/dL)  TSH     Status: None   Collection Time: 09/15/15  8:55 AM  Result Value Ref Range   TSH 1.75 0.35 - 4.50 uIU/mL  VITAMIN D 25 Hydroxy (Vit-D Deficiency, Fractures)     Status: None   Collection Time: 09/15/15  8:55 AM  Result Value Ref Range   VITD 55.71 30.00 - 100.00 ng/mL  Hepatitis C Antibody     Status: None   Collection Time: 09/15/15  8:55 AM  Result Value Ref Range   HCV Ab NEGATIVE NEGATIVE

## 2015-09-16 NOTE — Telephone Encounter (Signed)
PAP negative, neg HPV. Next PAP 3-5 years

## 2015-09-17 NOTE — Telephone Encounter (Signed)
Spoke with patient reviewed pap results.

## 2015-09-30 ENCOUNTER — Ambulatory Visit
Admission: RE | Admit: 2015-09-30 | Discharge: 2015-09-30 | Disposition: A | Discharge status: Home / Self Care | Payer: BLUE CROSS/BLUE SHIELD | Source: Ambulatory Visit | Attending: Family Medicine | Admitting: Family Medicine

## 2015-09-30 ENCOUNTER — Telehealth: Payer: Self-pay | Admitting: Family Medicine

## 2015-09-30 DIAGNOSIS — E2839 Other primary ovarian failure: Secondary | ICD-10-CM

## 2015-09-30 DIAGNOSIS — Z1239 Encounter for other screening for malignant neoplasm of breast: Secondary | ICD-10-CM

## 2015-09-30 NOTE — Telephone Encounter (Signed)
Left message with results and instructions on patient voice mail. 

## 2015-09-30 NOTE — Telephone Encounter (Signed)
Please call pt: - her bone density scan was normal. Continue vit d supplement as discussed.

## 2015-10-12 DIAGNOSIS — H16223 Keratoconjunctivitis sicca, not specified as Sjogren's, bilateral: Secondary | ICD-10-CM | POA: Diagnosis not present

## 2016-03-18 ENCOUNTER — Encounter: Payer: Self-pay | Admitting: Family Medicine

## 2016-03-18 ENCOUNTER — Ambulatory Visit (INDEPENDENT_AMBULATORY_CARE_PROVIDER_SITE_OTHER): Payer: BLUE CROSS/BLUE SHIELD | Admitting: Family Medicine

## 2016-03-18 VITALS — BP 123/79 | HR 75 | Temp 98.5°F | Resp 20 | Ht 68.0 in | Wt 145.5 lb

## 2016-03-18 DIAGNOSIS — M7042 Prepatellar bursitis, left knee: Secondary | ICD-10-CM

## 2016-03-18 DIAGNOSIS — Z23 Encounter for immunization: Secondary | ICD-10-CM

## 2016-03-18 NOTE — Patient Instructions (Signed)
Prepatellar Bursitis Prepatellar bursitis is inflammation of the prepatellar bursa. The prepatellar bursa is a fluid-filled sac that cushions the kneecap (patella). Prepatellar bursitis happens when fluid builds up in the this sac and causes it to get larger. The condition causes knee pain. What are the causes? This condition may be caused by:  Constant pressure on the knees from kneeling.  A hit to the knee.  Falling on the knee.  A bacterial infection.  Moving the knee often in a forceful way. What increases the risk? This condition is more likely to develop in:  People who play a sport that involves a risk for falls on the knee or hard hits (blows) to the knee. These sports include:  Football.  Wrestling.  Basketball.  Soccer.  People who have to kneel for long periods of time, such as roofers, plumbers, and gardeners.  People with another inflammatory condition, such as gout or rheumatoid arthritis. What are the signs or symptoms? The most common symptom of this condition is knee pain that gets better with rest. Other symptoms include:  Swelling on the front of the kneecap.  Warmth in the knee.  Tenderness with activity.  Redness in the knee.  Inability to bend the knee or to kneel. How is this diagnosed? This condition may be diagnosed based on:  Your symptoms.  Your medical history.  A physical exam. During the exam, your provider will compare your knees and check for tenderness and pain when moving your knee. Your health care provider may also use a needle to remove fluid from the bursa to help diagnose an infection.  Tests, such as:  A blood test that checks for infection.  X-rays. These may be taken to check the structure of the patella.  MRI or ultrasound. These may be done to check for swelling and fluid buildup in the bursa. How is this treated? This condition may be treated by:  Resting the knee.  Applying ice to the knee.  Medicine, such  as:  Nonsteroidal anti-inflammatory drugs (NSAIDs). These can help to reduce pain and swelling.  Antibiotic medicines. These may be needed if you have an infection.  Steroid medicines. These may be prescribed if other treatments are not helping.  Raising (elevating) the knee while resting.  Doing strengthening and stretching exercises (physical therapy). These may be recommended after pain and swelling improve.  Having a procedure to remove fluid from the bursa. This may be done if other treatment is not helping.  Having surgery to remove the bursa. This may be done if you have a severe infection or if the condition keeps coming back after treatment. Follow these instructions at home: Medicines  Take over-the-counter and prescription medicines only as told by your health care provider.  If you were prescribed an antibiotic medicine, take it as told by your health care provider. Do not stop taking the antibiotic even if you start to feel better. Managing pain, stiffness, and swelling  If directed, apply ice to your knee.  Put ice in a plastic bag.  Place a towel between your skin and the bag.  Leave the ice on for 20 minutes, 2-3 times a day.  Elevate your knee above the level of your heart while you are sitting or lying down. Driving  Do not drive or operate heavy machinery while taking prescription pain medicine.  Ask your health care provider when it is safe fpr you to drive. Activity  Rest your knee.  Avoid activities that cause pain.  Return to your normal activities as told by your health care provider. Ask your health care provider what activities are safe for you.  Do exercises as told by your health care provider. General instructions  Do not use the injured limb to support your body weight until your health care provider says that you can.  Do not use any tobacco products, such as cigarettes, chewing tobacco, and e-cigarettes. Tobacco can delay bone healing.  If you need help quitting, ask your health care provider.  Keep all follow-up visits as told by your health care provider. This is important. How is this prevented?  Warm up and stretch before being active.  Cool down and stretch after being active.  Give your body time to rest between periods of activity.  Make sure to use equipment that fits you.  Be safe and responsible while being active to avoid falls.  Do at least 150 minutes of moderate-intensity exercise each week, such as brisk walking or water aerobics.  Maintain physical fitness, including:  Strength.  Flexibility.  Cardiovascular fitness.  Endurance. Contact a health care provider if:  Your symptoms do not improve.  Your symptoms get worse.  Your symptoms keep coming back after treatment.  You develop a fever and have warmth, redness, and swelling over your knee. This information is not intended to replace advice given to you by your health care provider. Make sure you discuss any questions you have with your health care provider. Document Released: 04/04/2005 Document Revised: 12/08/2015 Document Reviewed: 01/02/2015 Elsevier Interactive Patient Education  2017 [employer]

## 2016-03-18 NOTE — Progress Notes (Signed)
Sheryl Morrison , 08-26-1955, 60 y.o., female MRN: [id number] Patient Care Team    Relationship Specialty Notifications Start End  Natalia Leatherwoodenee A Sheronica Corey, DO PCP - General Family Medicine  09/15/15   Beverley FiedlerJay M Pyrtle, MD Consulting Physician Gastroenterology  09/15/15     CC: left knee knot Subjective: Pt presents for an acute OV with complaints of left knee lump of 6 weeks duration. It occurred after she was kneeling on the floor and then turned. She states it felt like she kneeled on a tack and became warm. She states she thought it would just go away, but it has not.  She denies any current pain associated with the area. She denies bruising, fever, chills or erythema.   No Known Allergies Social History  Substance Use Topics  . Smoking status: Former Games developermoker  . Smokeless tobacco: Never Used  . Alcohol use 1.8 oz/week    3 Cans of beer per week     Comment: 2-4 beers on weekends   Past Medical History:  Diagnosis Date  . Anemia   . Chicken pox   . Urinary tract bacterial infections    Past Surgical History:  Procedure Laterality Date  . APPENDECTOMY  1963   Family History  Problem Relation Age of Onset  . Heart disease Mother   . Hypertension Mother   . Obesity Mother   . Heart disease Father   . Hypertension Father   . Alcohol abuse Sister   . Depression Daughter   . Obesity Daughter   . Alzheimer's disease Paternal Uncle   . Colon cancer Neg Hx   . Pancreatic cancer Neg Hx   . Rectal cancer Neg Hx   . Stomach cancer Neg Hx      Medication List       Accurate as of 03/18/16 10:04 AM. Always use your most recent med list.          aspirin 81 MG tablet Take 81 mg by mouth daily.   calcium carbonate 600 MG Tabs tablet Commonly known as:  OS-CAL Take 600 mg by mouth daily with breakfast. With vitamin D   Fish Oil 1000 MG Caps Take by mouth.   OMEGA 3 PO Take 1,000 Units by mouth.   METAMUCIL PO Take by mouth. 1 tsp per day   OVER THE COUNTER MEDICATION Multi  mature multivitamin   OVER THE COUNTER MEDICATION ocusight   Vitamin D3 1000 units Caps Take 2,000 Units by mouth. Mon Wed Fri   XIIDRA 5 % Soln Generic drug:  Lifitegrast       No results found for this or any previous visit (from the past 24 hour(s)). No results found.   ROS: Negative, with the exception of above mentioned in HPI   Objective:  BP 123/79 (BP Location: Right Arm, Patient Position: Sitting, Cuff Size: Normal)   Pulse 75   Temp 98.5 F (36.9 C)   Resp 20   Ht 5\' 8"  (1.727 m)   Wt 145 lb 8 oz (66 kg)   SpO2 99%   BMI 22.12 kg/m  Body mass index is 22.12 kg/m. Gen: Afebrile. No acute distress. Nontoxic in appearance, well developed, well nourished. pleasant caucasian female.  MSK: no erythema, no bruising, no hyperpigmentation. No effusion. Moderate Prepatellar bursa fluid present. No TTP, full range of motion.  no ligament laxity. NV intact.  Skin: No rashes, purpura or petechiae. WWW. Skin intact. Neuro: Normal gait. PERLA. EOMi. Alert. Oriented x3   Assessment/Plan:  Sheryl GriffinsCarole Morrison is a 60 y.o. female present for acute OV for   Need for prophylactic vaccination and inoculation against influenza - pt desires flu vaccination today.  - Flu Vaccine QUAD 36+ mos PF IM (Fluarix & Fluzone Quad PF)  Prepatellar bursitis of left knee - diagnosis, and treatments discussed with patient today.  - ice/heat, elevation, compression, NSAIDS can be tried, although this may be chronic without intervention/draining.  - discussed with pt if she desires drainage of the area, she can schedule an appt to do so. She is aware in some occurences it does return and may need steroid injection of further intervention.  - She states it is not bothering her currently and would like to wait to see if it self resolves with home therapy.  - AVS provided to pt on diagnosis. She was encouraged to monitor for any signs of infection (redness, fever, pain, knee swelling etc). Pt is agreement  with plan.  - F/u PRN   electronically signed by:  Felix Pacinienee Lakhia Gengler, DO  [facility] [place]

## 2016-03-25 ENCOUNTER — Ambulatory Visit (INDEPENDENT_AMBULATORY_CARE_PROVIDER_SITE_OTHER): Payer: BLUE CROSS/BLUE SHIELD | Admitting: Family Medicine

## 2016-03-25 ENCOUNTER — Encounter: Payer: Self-pay | Admitting: Family Medicine

## 2016-03-25 VITALS — BP 119/78 | HR 71 | Temp 98.2°F | Resp 20 | Wt 146.0 lb

## 2016-03-25 DIAGNOSIS — M7042 Prepatellar bursitis, left knee: Secondary | ICD-10-CM

## 2016-03-25 NOTE — Patient Instructions (Addendum)
Keep band aid on area until tomorrow.  Wrap in ACE wrap when you get home to keep pressure over knee cap for the next couple of days.  Sometimes this may re-collect, and drainage is needed again with steroid injection.

## 2016-03-25 NOTE — Progress Notes (Signed)
Sheryl GriffinsCarole Morrison , 02/21/1956, 60 y.o., female MRN: [id number] Patient Care Team    Relationship Specialty Notifications Start End  Natalia Leatherwoodenee A Kuneff, DO PCP - General Family Medicine  09/15/15   Beverley FiedlerJay M Pyrtle, MD Consulting Physician Gastroenterology  09/15/15     Subjective:  Prepatellar bursa of left knee. Pt returns today after being seen last week for left knee prepatellar bursitis. She states she would like to have it drained after all. She feels it is bothering more, and more pressure with wearing labs.   No Known Allergies Social History  Substance Use Topics  . Smoking status: Former Games developermoker  . Smokeless tobacco: Never Used  . Alcohol use 1.8 oz/week    3 Cans of beer per week     Comment: 2-4 beers on weekends   Past Medical History:  Diagnosis Date  . Anemia   . Chicken pox   . Urinary tract bacterial infections    Past Surgical History:  Procedure Laterality Date  . APPENDECTOMY  1963   Family History  Problem Relation Age of Onset  . Heart disease Mother   . Hypertension Mother   . Obesity Mother   . Heart disease Father   . Hypertension Father   . Alcohol abuse Sister   . Depression Daughter   . Obesity Daughter   . Alzheimer's disease Paternal Uncle   . Colon cancer Neg Hx   . Pancreatic cancer Neg Hx   . Rectal cancer Neg Hx   . Stomach cancer Neg Hx      Medication List       Accurate as of 03/25/16 11:45 AM. Always use your most recent med list.          aspirin 81 MG tablet Take 81 mg by mouth daily.   calcium carbonate 600 MG Tabs tablet Commonly known as:  OS-CAL Take 600 mg by mouth daily with breakfast. With vitamin D   Fish Oil 1000 MG Caps Take by mouth.   OMEGA 3 PO Take 1,000 Units by mouth.   METAMUCIL PO Take by mouth. 1 tsp per day   OVER THE COUNTER MEDICATION Multi mature multivitamin   OVER THE COUNTER MEDICATION ocusight   Vitamin D3 1000 units Caps Take 2,000 Units by mouth. Mon Wed Fri   XIIDRA 5 % Soln Generic  drug:  Lifitegrast       No results found for this or any previous visit (from the past 24 hour(s)). No results found.   ROS: Negative, with the exception of above mentioned in HPI   Objective:  BP 119/78 (BP Location: Right Arm, Patient Position: Sitting, Cuff Size: Normal)   Pulse 71   Temp 98.2 F (36.8 C)   Resp 20   Wt 146 lb (66.2 kg)   SpO2 98%   BMI 22.20 kg/m  Body mass index is 22.2 kg/m. Gen: Afebrile. No acute distress. Nontoxic in appearance, well developed, well nourished.  MSK: moderate prepatellar bursa fluid collection left knee. No erythema.   Assessment/Plan: Sheryl GriffinsCarole Domagalski is a 60 y.o. female present for  OV for Prepatellar bursa drainage of left knee.  Consent obtained and verified. Sterile betadine prep. Furthur cleansed with alcohol. Topical analgesic spray: Ethyl chloride. Joint:left knee- prepatellar bursa drained of 6.75 cc clear yellow thin fluid. No bleeding.  Approached in typical (inferior) fashion with: Completed without difficulty. Pt felt dizzy after discussing home instructions. She was monitored for 10 minutes, given water and reported she felt much  better as was able to go.  Meds: None Needle: 18 g Aftercare instructions and Red flags advised.  F/U PRN only.    > 25 minutes spent with patient, >50% of time spent face to face  electronically signed by:  Felix Pacinienee Kuneff, DO  [facility] [place]

## 2016-09-15 ENCOUNTER — Encounter: Payer: Self-pay | Admitting: Family Medicine

## 2016-09-15 ENCOUNTER — Ambulatory Visit (INDEPENDENT_AMBULATORY_CARE_PROVIDER_SITE_OTHER): Payer: BLUE CROSS/BLUE SHIELD | Admitting: Family Medicine

## 2016-09-15 VITALS — BP 111/75 | HR 71 | Temp 98.3°F | Resp 20 | Ht 68.0 in | Wt 143.5 lb

## 2016-09-15 DIAGNOSIS — M545 Low back pain, unspecified: Secondary | ICD-10-CM

## 2016-09-15 DIAGNOSIS — Z0000 Encounter for general adult medical examination without abnormal findings: Secondary | ICD-10-CM

## 2016-09-15 DIAGNOSIS — E784 Other hyperlipidemia: Secondary | ICD-10-CM | POA: Diagnosis not present

## 2016-09-15 DIAGNOSIS — Z131 Encounter for screening for diabetes mellitus: Secondary | ICD-10-CM

## 2016-09-15 DIAGNOSIS — E559 Vitamin D deficiency, unspecified: Secondary | ICD-10-CM

## 2016-09-15 DIAGNOSIS — E7849 Other hyperlipidemia: Secondary | ICD-10-CM

## 2016-09-15 LAB — COMPREHENSIVE METABOLIC PANEL
ALBUMIN: 4.8 g/dL (ref 3.5–5.2)
ALT: 15 U/L (ref 0–35)
AST: 22 U/L (ref 0–37)
Alkaline Phosphatase: 67 U/L (ref 39–117)
BILIRUBIN TOTAL: 0.8 mg/dL (ref 0.2–1.2)
BUN: 20 mg/dL (ref 6–23)
CALCIUM: 10.1 mg/dL (ref 8.4–10.5)
CO2: 32 mEq/L (ref 19–32)
CREATININE: 0.79 mg/dL (ref 0.40–1.20)
Chloride: 103 mEq/L (ref 96–112)
GFR: 78.33 mL/min (ref >60.00)
Glucose, Bld: 89 mg/dL (ref 70–99)
Potassium: 4.4 mEq/L (ref 3.5–5.1)
Sodium: 140 mEq/L (ref 135–145)
Total Protein: 7.5 g/dL (ref 6.0–8.3)

## 2016-09-15 LAB — CBC WITH DIFFERENTIAL/PLATELET
BASOS ABS: 0 10*3/uL (ref 0.0–0.1)
BASOS PCT: 0.3 % (ref 0.0–3.0)
EOS ABS: 0.1 10*3/uL (ref 0.0–0.7)
Eosinophils Relative: 3.5 % (ref 0.0–5.0)
HEMATOCRIT: 39.4 % (ref 36.0–46.0)
HEMOGLOBIN: 13.2 g/dL (ref 12.0–15.0)
LYMPHS PCT: 38.9 % (ref 12.0–46.0)
Lymphs Abs: 1.6 10*3/uL (ref 0.7–4.0)
MCHC: 33.5 g/dL (ref 30.0–36.0)
MCV: 88.8 fl (ref 78.0–100.0)
Monocytes Absolute: 0.4 10*3/uL (ref 0.1–1.0)
Monocytes Relative: 9.4 % (ref 3.0–12.0)
Neutro Abs: 2 10*3/uL (ref 1.4–7.7)
Neutrophils Relative %: 47.9 % (ref 43.0–77.0)
Platelets: 308 10*3/uL (ref 150.0–400.0)
RBC: 4.44 Mil/uL (ref 3.87–5.11)
RDW: 14 % (ref 11.5–15.5)
WBC: 4.2 10*3/uL (ref 4.0–10.5)

## 2016-09-15 LAB — LIPID PANEL
CHOLESTEROL: 269 mg/dL — AB (ref 0–200)
HDL: 88.2 mg/dL (ref >39.00)
LDL Cholesterol: 168 mg/dL — ABNORMAL HIGH (ref 0–99)
NonHDL: 180.44
Total CHOL/HDL Ratio: 3
Triglycerides: 64 mg/dL (ref 0.0–149.0)
VLDL: 12.8 mg/dL (ref 0.0–40.0)

## 2016-09-15 LAB — VITAMIN D 25 HYDROXY (VIT D DEFICIENCY, FRACTURES): VITD: 47.02 ng/mL (ref 30.00–100.00)

## 2016-09-15 LAB — HEMOGLOBIN A1C: HEMOGLOBIN A1C: 5.5 % (ref 4.6–6.5)

## 2016-09-15 LAB — TSH: TSH: 2.19 u[IU]/mL (ref 0.35–4.50)

## 2016-09-15 NOTE — Patient Instructions (Signed)
Preventive Care 40-64 Years, Female Preventive care refers to lifestyle choices and visits with your health care provider that can promote health and wellness. What does preventive care include?  A yearly physical exam. This is also called an annual well check.  Dental exams once or twice a year.  Routine eye exams. Ask your health care provider how often you should have your eyes checked.  Personal lifestyle choices, including: ? Daily care of your teeth and gums. ? Regular physical activity. ? Eating a healthy diet. ? Avoiding tobacco and drug use. ? Limiting alcohol use. ? Practicing safe sex. ? Taking low-dose aspirin daily starting at age 58. ? Taking vitamin and mineral supplements as recommended by your health care provider. What happens during an annual well check? The services and screenings done by your health care provider during your annual well check will depend on your age, overall health, lifestyle risk factors, and family history of disease. Counseling Your health care provider may ask you questions about your:  Alcohol use.  Tobacco use.  Drug use.  Emotional well-being.  Home and relationship well-being.  Sexual activity.  Eating habits.  Work and work Statistician.  Method of birth control.  Menstrual cycle.  Pregnancy history.  Screening You may have the following tests or measurements:  Height, weight, and BMI.  Blood pressure.  Lipid and cholesterol levels. These may be checked every 5 years, or more frequently if you are over 81 years old.  Skin check.  Lung cancer screening. You may have this screening every year starting at age 78 if you have a 30-pack-year history of smoking and currently smoke or have quit within the past 15 years.  Fecal occult blood test (FOBT) of the stool. You may have this test every year starting at age 65.  Flexible sigmoidoscopy or colonoscopy. You may have a sigmoidoscopy every 5 years or a colonoscopy  every 10 years starting at age 30.  Hepatitis C blood test.  Hepatitis B blood test.  Sexually transmitted disease (STD) testing.  Diabetes screening. This is done by checking your blood sugar (glucose) after you have not eaten for a while (fasting). You may have this done every 1-3 years.  Mammogram. This may be done every 1-2 years. Talk to your health care provider about when you should start having regular mammograms. This may depend on whether you have a family history of breast cancer.  BRCA-related cancer screening. This may be done if you have a family history of breast, ovarian, tubal, or peritoneal cancers.  Pelvic exam and Pap test. This may be done every 3 years starting at age 80. Starting at age 36, this may be done every 5 years if you have a Pap test in combination with an HPV test.  Bone density scan. This is done to screen for osteoporosis. You may have this scan if you are at high risk for osteoporosis.  Discuss your test results, treatment options, and if necessary, the need for more tests with your health care provider. Vaccines Your health care provider may recommend certain vaccines, such as:  Influenza vaccine. This is recommended every year.  Tetanus, diphtheria, and acellular pertussis (Tdap, Td) vaccine. You may need a Td booster every 10 years.  Varicella vaccine. You may need this if you have not been vaccinated.  Zoster vaccine. You may need this after age 5.  Measles, mumps, and rubella (MMR) vaccine. You may need at least one dose of MMR if you were born in  1957 or later. You may also need a second dose.  Pneumococcal 13-valent conjugate (PCV13) vaccine. You may need this if you have certain conditions and were not previously vaccinated.  Pneumococcal polysaccharide (PPSV23) vaccine. You may need one or two doses if you smoke cigarettes or if you have certain conditions.  Meningococcal vaccine. You may need this if you have certain  conditions.  Hepatitis A vaccine. You may need this if you have certain conditions or if you travel or work in places where you may be exposed to hepatitis A.  Hepatitis B vaccine. You may need this if you have certain conditions or if you travel or work in places where you may be exposed to hepatitis B.  Haemophilus influenzae type b (Hib) vaccine. You may need this if you have certain conditions.  Talk to your health care provider about which screenings and vaccines you need and how often you need them. This information is not intended to replace advice given to you by your health care provider. Make sure you discuss any questions you have with your health care provider. Document Released: 05/01/2015 Document Revised: 12/23/2015 Document Reviewed: 02/03/2015 Elsevier Interactive Patient Education  2017 [employer].

## 2016-09-15 NOTE — Progress Notes (Signed)
Patient ID: Sheryl Morrison, female   DOB: 09/19/1955, 61 y.o.   MRN: [id number]      Patient ID: Sheryl Morrison, female  DOB: 1955-07-22, 61 y.o.   MRN: [id number]  Subjective:  Sheryl Morrison is a 61 y.o. female present for annual visit with PAP. All past medical history, surgical history, allergies, family history, immunizations, medications and social history were obtained/updated in the electronic medical record today. All recent labs, ED visits and hospitalizations within the last year were reviewed. Pt has no complaints today. Pt has no complaints today.  Patient Care Team    Relationship Specialty Notifications Start End  Ma Hillock, DO PCP - General Family Medicine  09/15/15   Pyrtle, Lajuan Lines, MD Consulting Physician Gastroenterology  09/15/15    Low back pain: Pt states she had a h/o mild bulging disc many years ago via an MRI. She has had no issues with her back in years, but recently has noticed it starting to bother her when sitting mostly. She has not tried anything OTC. She is wondering if massage is a good option for her. She denies radiation of pain, bladder or bowel changes. No prior images available.   Health maintenance: All reviewed and updated 09/15/2016 Colonoscopy: 2015, normal (diverticulosis only), no FHX colon cancer. 10 year f/u Dr. Hilarie Fredrickson.  Mammogram: 09/2015; normal birads 1, no fhx. Would like to have every 2 years.  Cervical cancer screening: 2017 PAP, normal PAP with HPV co-test negative. Immunizations: zostavax 2017, would like to wait on shingrix. Tdap 2014 UTD, Flu UTD 2017 (encouraged yearly) Infectious disease screening: HIV and  Hep C completed DEXA: 09/2015- normal  Assistive device: None  Oxygen KZS:WFUX  Patient has a Dental home. Hospitalizations/ED visits: None   Depression screen [facility] 2/9 09/15/2016 09/15/2015  Decreased Interest 0 0  Down, Depressed, Hopeless 0 0  PHQ - 2 Score 0 0    Fall Risk  09/15/2016 09/15/2015  Falls in the past year? No No    Current Exercise Habits: Home exercise routine, Type of exercise: walking, Time (Minutes): 45, Frequency (Times/Week): 7, Weekly Exercise (Minutes/Week): 315, Intensity: Mild Exercise limited by: None identified   Past Medical History:  Diagnosis Date  . Anemia   . Chicken pox   . Urinary tract bacterial infections    No Known Allergies Past Surgical History:  Procedure Laterality Date  . APPENDECTOMY  1963   Family History  Problem Relation Age of Onset  . Heart disease Mother   . Hypertension Mother   . Obesity Mother   . Heart disease Father   . Hypertension Father   . Alcohol abuse Sister   . Depression Daughter   . Obesity Daughter   . Alzheimer's disease Paternal Uncle   . Colon cancer Neg Hx   . Pancreatic cancer Neg Hx   . Rectal cancer Neg Hx   . Stomach cancer Neg Hx    Social History   Social History  . Marital status: Married    Spouse name: N/A  . Number of children: 1  . Years of education: 63   Occupational History  . retired    Social History Main Topics  . Smoking status: Former Research scientist (life sciences)  . Smokeless tobacco: Never Used  . Alcohol use 1.8 oz/week    3 Cans of beer per week     Comment: 2-4 beers on weekends  . Drug use: No  . Sexual activity: Yes    Birth control/ protection: Post-menopausal   Other  Topics Concern  . Not on file   Social History Narrative   Lives with husband. She has 1 Daughter.    Retired. Librarian  at a school.    Wears her seatbelt.    Smoke detector int he home.    Wear sunscreen.    Takes a vitamin.    Ambulated independently.    Feels safe in relationships         ROS: Negative, with the exception of above mentioned in HPI  Objective: BP 111/75 (BP Location: Left Arm, Patient Position: Sitting, Cuff Size: Normal)   Pulse 71   Temp 98.3 F (36.8 C)   Resp 20   Ht '5\' 8"'$  (1.727 m)   Wt 143 lb 8 oz (65.1 kg)   SpO2 98%   BMI 21.82 kg/m  Gen: Afebrile. No acute distress. Nontoxic, pleasant caucasian  female.  HENT: AT. [place]. Bilateral TM visualized and normal in appearance. MMM. Bilateral nares without erythema or buldging. Throat without erythema or exudates. No cough, no hoarseness, good dental hygiene Eyes:Pupils Equal Round Reactive to light, Extraocular movements intact,  Conjunctiva without redness, discharge or icterus. Neck/lymp/endocrine: Supple,no lymphadenopathy, no thyromegaly CV: RRR no murmur, no edema, +2/4 P posterior tibialis pulses Chest: CTAB, no wheeze or crackles Abd: Soft. flat. NTND. BS present. no Masses palpated.  MSK: no erythema, no soft tissue swelling, no bone tenderness or step off of lumbar spine. Mild TTP bilateral SI joint only. NV intact distally.  Skin: no rashes, purpura or petechiae. WWP, intact Neuro:  Normal gait. PERLA. EOMi. Alert. Oriented. Cranial nerves II through XII intact. Muscle strength 5/5 upper/lower extremity. DTRs equal bilaterally. Psych: Normal affect, dress and demeanor. Normal speech. Normal thought content and judgment.   Assessment/plan: Sheryl Morrison is a 61 y.o. female present for annual exam.  Lumbago:  -  Discussed OTC pain relief, exercise, strengthening, heat and massage. - No alarm signs or radiation of pain.  - if not improved or worsening would have her follow up and consider image/OMT/referral. Pt in agreement with plan. She was educated on alarm signs.  Vitamin D deficiency - Vitamin D (25 hydroxy) Other hyperlipidemia - currently taking fish oil supplement 1000u daily  - CBC w/Diff - Comp Met (CMET) - TSH - Lipid panel Encounter for preventive health examination Patient was encouraged to consume a high fiber diet, plenty of vegetables and lean meats. Exercise > 150 minutes a week.  AVS on preventive health maintenance for age/gender was provided to patient for education.  Colonoscopy: 2015, normal (diverticulosis only), no FHX colon cancer. 10 year f/u Dr. Hilarie Fredrickson.  Mammogram: 09/2015; normal birads 1, no fhx. Would  like to have every 2 years.  Cervical cancer screening: 2017 PAP, normal PAP with HPV co-test negative. Immunizations: zostavax 2017, would liek to wait on shingrix. [id number] UTD, Flu UTD 2017 (encouraged yearly) Infectious disease screening: HIV and  Hep C completed DEXA: 09/2015- normal, continue vitd/ca supplement--> weight bearing exercise - CBC w/Diff - Comp Met (CMET) Screening for diabetes mellitus - HgB A1c   Return in about 1 year (around 09/16/2017) for CPE.  Electronically signed by: Howard Pouch, DO [place]

## 2016-09-16 ENCOUNTER — Telehealth: Payer: Self-pay | Admitting: Family Medicine

## 2016-09-16 ENCOUNTER — Encounter: Payer: Self-pay | Admitting: *Deleted

## 2016-09-16 NOTE — Telephone Encounter (Signed)
Sent patient a message in My Chart to call and update us on her fish oil dosage and if interested in starting a statin.

## 2016-09-16 NOTE — Telephone Encounter (Signed)
Spoke with patient she states she has only been taking 1000 units daily of fish oil. Reviewed lab results and recommendations with patient. She states she does not want to start a statin but will increase her fish oil to 3000 units daily . Changed dosing amount of fish oil in chart to reflect increase in dosage.

## 2016-09-16 NOTE — Telephone Encounter (Signed)
Please call pt: - her labs all look great, except her cholesterol. It is still higher than desired, about the same as prior. She has declined statin use multiple times in the past. Last year we asked her to increase her fish oil to 2000-3000u nightly. Please make sure she is doing that, and change record to reflect the dose she is using.  If she is taking in 3000u daily, then I again recommend a statin low dose given fhx and her LDL > 160. If she wants to try, I will call it in for her.    Lipid Panel     Component Value Date/Time   CHOL 269 (H) 09/15/2016 0847   TRIG 64.0 09/15/2016 0847   HDL 88.20 09/15/2016 0847   CHOLHDL 3 09/15/2016 0847   VLDL 12.8 09/15/2016 0847   LDLCALC 168 (H) 09/15/2016 0847   LDLDIRECT 156.4 09/12/2012 1033

## 2016-09-19 ENCOUNTER — Other Ambulatory Visit: Payer: Self-pay | Admitting: *Deleted

## 2016-09-19 ENCOUNTER — Telehealth: Payer: Self-pay | Admitting: Family Medicine

## 2016-09-19 ENCOUNTER — Encounter: Payer: Self-pay | Admitting: *Deleted

## 2016-09-19 DIAGNOSIS — E7849 Other hyperlipidemia: Secondary | ICD-10-CM

## 2016-09-19 MED ORDER — ATORVASTATIN CALCIUM 10 MG PO TABS: 10.0000 mg | ORAL_TABLET | Freq: Every day | ORAL | 1 refills | Status: DC

## 2016-09-19 MED ORDER — ATORVASTATIN CALCIUM 10 MG PO TABS: 20.0000 mg | ORAL_TABLET | Freq: Every day | ORAL | 1 refills | Status: DC

## 2016-09-19 NOTE — Telephone Encounter (Signed)
Orders placed sent patient message in My Chart with information and instructions for follow up.

## 2016-09-19 NOTE — Telephone Encounter (Signed)
Called in lipitor 10 mg nightly (lowest dose available). Follow up 2-3 months provider,  with repeat lipids/lft 2 days prior by lab appt. Fasting at least 9 hours.  Please place future orders.

## 2016-11-28 ENCOUNTER — Other Ambulatory Visit (INDEPENDENT_AMBULATORY_CARE_PROVIDER_SITE_OTHER): Payer: BLUE CROSS/BLUE SHIELD

## 2016-11-28 DIAGNOSIS — E784 Other hyperlipidemia: Secondary | ICD-10-CM

## 2016-11-28 DIAGNOSIS — E7849 Other hyperlipidemia: Secondary | ICD-10-CM

## 2016-11-28 LAB — LIPID PANEL
CHOL/HDL RATIO: 2
CHOLESTEROL: 169 mg/dL (ref 0–200)
HDL: 70.8 mg/dL (ref >39.00)
LDL CALC: 80 mg/dL (ref 0–99)
NonHDL: 98.35
TRIGLYCERIDES: 93 mg/dL (ref 0.0–149.0)
VLDL: 18.6 mg/dL (ref 0.0–40.0)

## 2016-11-28 LAB — BASIC METABOLIC PANEL
BUN: 20 mg/dL (ref 6–23)
CALCIUM: 9.7 mg/dL (ref 8.4–10.5)
CHLORIDE: 103 meq/L (ref 96–112)
CO2: 31 mEq/L (ref 19–32)
Creatinine, Ser: 0.71 mg/dL (ref 0.40–1.20)
GFR: 88.55 mL/min (ref >60.00)
Glucose, Bld: 95 mg/dL (ref 70–99)
Potassium: 5.3 mEq/L — ABNORMAL HIGH (ref 3.5–5.1)
SODIUM: 140 meq/L (ref 135–145)

## 2016-11-29 ENCOUNTER — Telehealth: Payer: Self-pay | Admitting: Family Medicine

## 2016-11-29 NOTE — Telephone Encounter (Signed)
Pre- appt labs resulted, much improved cholesterol. Appt scheduled tomorrow for review.

## 2016-11-30 ENCOUNTER — Encounter: Payer: Self-pay | Admitting: Family Medicine

## 2016-11-30 ENCOUNTER — Other Ambulatory Visit (INDEPENDENT_AMBULATORY_CARE_PROVIDER_SITE_OTHER): Payer: BLUE CROSS/BLUE SHIELD

## 2016-11-30 ENCOUNTER — Ambulatory Visit (INDEPENDENT_AMBULATORY_CARE_PROVIDER_SITE_OTHER): Payer: BLUE CROSS/BLUE SHIELD | Admitting: Family Medicine

## 2016-11-30 VITALS — BP 120/77 | HR 75 | Temp 98.2°F | Resp 20 | Ht 68.0 in | Wt 146.8 lb

## 2016-11-30 DIAGNOSIS — Z79899 Other long term (current) drug therapy: Secondary | ICD-10-CM

## 2016-11-30 DIAGNOSIS — E784 Other hyperlipidemia: Secondary | ICD-10-CM | POA: Diagnosis not present

## 2016-11-30 DIAGNOSIS — E7849 Other hyperlipidemia: Secondary | ICD-10-CM

## 2016-11-30 LAB — HEPATIC FUNCTION PANEL
ALBUMIN: 4.7 g/dL (ref 3.5–5.2)
ALK PHOS: 61 U/L (ref 39–117)
ALT: 15 U/L (ref 0–35)
AST: 18 U/L (ref 0–37)
BILIRUBIN DIRECT: 0.2 mg/dL (ref 0.0–0.3)
TOTAL PROTEIN: 6.8 g/dL (ref 6.0–8.3)
Total Bilirubin: 0.7 mg/dL (ref 0.2–1.2)

## 2016-11-30 MED ORDER — ATORVASTATIN CALCIUM 10 MG PO TABS: 10.0000 mg | ORAL_TABLET | Freq: Every day | ORAL | 3 refills | Status: DC

## 2016-11-30 NOTE — Patient Instructions (Addendum)
Blood work is Animatorbeautiful.  Great to see you today.

## 2016-11-30 NOTE — Progress Notes (Signed)
Patient ID: Makia Bossi, female   DOB: 1955-09-18, 61 y.o.   MRN: [id number]      Patient ID: Rosalind Guido, female  DOB: February 20, 1956, 61 y.o.   MRN: [id number]  Chief Complaint  Patient presents with  . Results   Patient Care Team    Relationship Specialty Notifications Start End  Natalia Leatherwood, DO PCP - General Family Medicine  09/15/15   Pyrtle, Carie Caddy, MD Consulting Physician Gastroenterology  09/15/15     Subjective:  Takenya Travaglini is a 61 y.o. female present for f/u on hyperlipidemia and start of Lipitor. All recent labs, ED visits and hospitalizations within the last year were reviewed.   Hyperlipidemia: Pt tolerating liptior 10 mg QD without side effects. She is taking 2000 mg fish oil a day as well. Reviewed repeat labs with her today that look great. Cholesterol is responding well to low dose Lipitor. She has not greatly changed her diet and exercise regimen. She routinely walks about 4 miles a day.  Depression screen [facility] 2/9 09/15/2016 09/15/2015  Decreased Interest 0 0  Down, Depressed, Hopeless 0 0  PHQ - 2 Score 0 0    Fall Risk  09/15/2016 09/15/2015  Falls in the past year? No No    Past Medical History:  Diagnosis Date  . Anemia   . Chicken pox   . Urinary tract bacterial infections    No Known Allergies Past Surgical History:  Procedure Laterality Date  . APPENDECTOMY  1963   Family History  Problem Relation Age of Onset  . Heart disease Mother   . Hypertension Mother   . Obesity Mother   . Heart disease Father   . Hypertension Father   . Alcohol abuse Sister   . Depression Daughter   . Obesity Daughter   . Alzheimer's disease Paternal Uncle   . Colon cancer Neg Hx   . Pancreatic cancer Neg Hx   . Rectal cancer Neg Hx   . Stomach cancer Neg Hx    Social History   Social History  . Marital status: Married    Spouse name: N/A  . Number of children: 1  . Years of education: 19   Occupational History  . retired    Social History Main Topics  .  Smoking status: Former Games developer  . Smokeless tobacco: Never Used  . Alcohol use 1.8 oz/week    3 Cans of beer per week     Comment: 2-4 beers on weekends  . Drug use: No  . Sexual activity: Yes    Birth control/ protection: Post-menopausal   Other Topics Concern  . Not on file   Social History Narrative   Lives with husband. She has 1 Daughter.    Retired. Librarian  at a school.    Wears her seatbelt.    Smoke detector int he home.    Wear sunscreen.    Takes a vitamin.    Ambulated independently.    Feels safe in relationships         ROS: Negative, with the exception of above mentioned in HPI  Objective: BP 120/77 (BP Location: Right Arm, Patient Position: Sitting, Cuff Size: Normal)   Pulse 75   Temp 98.2 F (36.8 C)   Resp 20   Ht 5\' 8"  (1.727 m)   Wt 146 lb 12 oz (66.6 kg)   SpO2 99%   BMI 22.31 kg/m  Gen: Afebrile. No acute distress.  HENT: AT. [place].  MMM.  CV: RRR ,  no edema Chest: CTAB, no wheeze or crackles Abd: Soft. NTND. BS present.   Neuro: Normal gait. PERLA. EOMi. Alert. Oriented x3  Assessment/plan: Caren GriffinsCarole Decarli is a 61 y.o. female present for  hyperlipidemia - currently taking fish oil supplement 2000u daily and started the Lipitor 10 mg QD. Cholesterol responded greatly to meds.  - Continue lipitor, refills provided today.  - Will follow yearly with physical.   Electronically signed by: Felix Pacinienee Tya Haughey, DO [facility] [place]

## 2017-06-04 ENCOUNTER — Other Ambulatory Visit: Payer: Self-pay | Admitting: Family Medicine

## 2017-09-18 ENCOUNTER — Ambulatory Visit (INDEPENDENT_AMBULATORY_CARE_PROVIDER_SITE_OTHER): Payer: BLUE CROSS/BLUE SHIELD | Admitting: Family Medicine

## 2017-09-18 ENCOUNTER — Encounter: Payer: Self-pay | Admitting: Family Medicine

## 2017-09-18 VITALS — BP 121/82 | HR 66 | Temp 98.1°F | Resp 20 | Ht 68.0 in | Wt 147.0 lb

## 2017-09-18 DIAGNOSIS — E559 Vitamin D deficiency, unspecified: Secondary | ICD-10-CM | POA: Diagnosis not present

## 2017-09-18 DIAGNOSIS — Z1231 Encounter for screening mammogram for malignant neoplasm of breast: Secondary | ICD-10-CM

## 2017-09-18 DIAGNOSIS — Z79899 Other long term (current) drug therapy: Secondary | ICD-10-CM | POA: Diagnosis not present

## 2017-09-18 DIAGNOSIS — Z130 Encounter for screening for diseases of the blood and blood-forming organs and certain disorders involving the immune mechanism: Secondary | ICD-10-CM

## 2017-09-18 DIAGNOSIS — E7849 Other hyperlipidemia: Secondary | ICD-10-CM

## 2017-09-18 DIAGNOSIS — Z1239 Encounter for other screening for malignant neoplasm of breast: Secondary | ICD-10-CM

## 2017-09-18 DIAGNOSIS — R591 Generalized enlarged lymph nodes: Secondary | ICD-10-CM | POA: Diagnosis not present

## 2017-09-18 DIAGNOSIS — Z131 Encounter for screening for diabetes mellitus: Secondary | ICD-10-CM

## 2017-09-18 DIAGNOSIS — Z0001 Encounter for general adult medical examination with abnormal findings: Secondary | ICD-10-CM | POA: Diagnosis not present

## 2017-09-18 LAB — COMPREHENSIVE METABOLIC PANEL
ALT: 14 U/L (ref 0–35)
AST: 20 U/L (ref 0–37)
Albumin: 4.4 g/dL (ref 3.5–5.2)
Alkaline Phosphatase: 65 U/L (ref 39–117)
BUN: 17 mg/dL (ref 6–23)
CHLORIDE: 104 meq/L (ref 96–112)
CO2: 30 meq/L (ref 19–32)
CREATININE: 0.73 mg/dL (ref 0.40–1.20)
Calcium: 9.7 mg/dL (ref 8.4–10.5)
GFR: 85.53 mL/min (ref >60.00)
GLUCOSE: 92 mg/dL (ref 70–99)
Potassium: 4.5 mEq/L (ref 3.5–5.1)
SODIUM: 140 meq/L (ref 135–145)
Total Bilirubin: 0.7 mg/dL (ref 0.2–1.2)
Total Protein: 6.7 g/dL (ref 6.0–8.3)

## 2017-09-18 LAB — CBC WITH DIFFERENTIAL/PLATELET
BASOS ABS: 0.1 10*3/uL (ref 0.0–0.1)
Basophils Relative: 1.2 % (ref 0.0–3.0)
EOS ABS: 0.2 10*3/uL (ref 0.0–0.7)
Eosinophils Relative: 5.4 % — ABNORMAL HIGH (ref 0.0–5.0)
HCT: 35.9 % — ABNORMAL LOW (ref 36.0–46.0)
Hemoglobin: 12 g/dL (ref 12.0–15.0)
LYMPHS PCT: 29.2 % (ref 12.0–46.0)
Lymphs Abs: 1.2 10*3/uL (ref 0.7–4.0)
MCHC: 33.5 g/dL (ref 30.0–36.0)
MCV: 88.5 fl (ref 78.0–100.0)
MONO ABS: 0.4 10*3/uL (ref 0.1–1.0)
Monocytes Relative: 9.9 % (ref 3.0–12.0)
NEUTROS ABS: 2.2 10*3/uL (ref 1.4–7.7)
Neutrophils Relative %: 54.3 % (ref 43.0–77.0)
PLATELETS: 249 10*3/uL (ref 150.0–400.0)
RBC: 4.06 Mil/uL (ref 3.87–5.11)
RDW: 14.3 % (ref 11.5–15.5)
WBC: 4.1 10*3/uL (ref 4.0–10.5)

## 2017-09-18 LAB — LIPID PANEL
CHOL/HDL RATIO: 2
Cholesterol: 179 mg/dL (ref 0–200)
HDL: 76.8 mg/dL (ref >39.00)
LDL CALC: 89 mg/dL (ref 0–99)
NONHDL: 102.61
Triglycerides: 67 mg/dL (ref 0.0–149.0)
VLDL: 13.4 mg/dL (ref 0.0–40.0)

## 2017-09-18 LAB — HEMOGLOBIN A1C: HEMOGLOBIN A1C: 5.7 % (ref 4.6–6.5)

## 2017-09-18 LAB — TSH: TSH: 2.04 u[IU]/mL (ref 0.35–4.50)

## 2017-09-18 LAB — VITAMIN D 25 HYDROXY (VIT D DEFICIENCY, FRACTURES): VITD: 51.4 ng/mL (ref 30.00–100.00)

## 2017-09-18 MED ORDER — ZOSTER VAC RECOMB ADJUVANTED 50 MCG/0.5ML IM SUSR: 0.5000 mL | Freq: Once | INTRAMUSCULAR | 1 refills | Status: AC

## 2017-09-18 NOTE — Progress Notes (Signed)
Patient ID: Sheryl Morrison, female  DOB: 04-04-1956, 61 y.o.   MRN: [id number] Patient Care Team    Relationship Specialty Notifications Start End  Sheryl Leatherwood, DO PCP - General Family Medicine  09/15/15   Pyrtle, Carie Caddy, MD Consulting Physician Gastroenterology  09/15/15     Chief Complaint  Patient presents with  . Annual Exam    Subjective:  Sheryl Morrison is a 62 y.o.  Female  present for CPE. All past medical history, surgical history, allergies, family history, immunizations, medications and social history were updated in the electronic medical record today. All recent labs, ED visits and hospitalizations within the last year were reviewed.  Health maintenance:  Colonoscopy: 2015, normal (diverticulosis only), no FHX colon cancer. 10 year f/u Dr. Rhea Morrison.  Mammogram: 09/2015; normal birads 1, no fhx. Would like to have every 2 years. SBE encouraged. Cervical cancer screening: 2017 PAP, normal PAP with HPV co-test negative. Immunizations: zostavax 2017, printed shingrix (she will check w/ insurance). Tdap 2014 UTD, Flu  2017 (encouraged yearly) Infectious disease screening: HIV and  Hep C completed DEXA: 09/2015- normal  Assistive device: none Oxygen WUJ:WJXB Patient has a Dental home. Hospitalizations/ED visits: reviewed  Depression screen [facility] 2/9 09/18/2017 09/15/2016 09/15/2015  Decreased Interest 0 0 0  Down, Depressed, Hopeless 0 0 0  PHQ - 2 Score 0 0 0   No flowsheet data found. Fall Risk  09/18/2017 09/15/2016 09/15/2015  Falls in the past year? No No No     Current Exercise Habits: Home exercise routine, Type of exercise: walking, Time (Minutes): 60, Frequency (Times/Week): 7, Weekly Exercise (Minutes/Week): 420, Intensity: Mild Exercise limited by: None identified   Immunization History  Administered Date(s) Administered  . Influenza,inj,Quad PF,6+ Mos 03/18/2016, 03/03/2017  . Tdap 09/12/2012  . Zoster 09/17/2015     Past Medical History:  Diagnosis Date    . Anemia   . Chicken pox   . Urinary tract bacterial infections    No Known Allergies Past Surgical History:  Procedure Laterality Date  . APPENDECTOMY  1963   Family History  Problem Relation Age of Onset  . Heart disease Mother   . Hypertension Mother   . Obesity Mother   . Heart disease Father   . Hypertension Father   . Alcohol abuse Sister   . Depression Daughter   . Obesity Daughter   . Alzheimer's disease Paternal Uncle   . Colon cancer Neg Hx   . Pancreatic cancer Neg Hx   . Rectal cancer Neg Hx   . Stomach cancer Neg Hx    Social History   Socioeconomic History  . Marital status: Married    Spouse name: Not on file  . Number of children: 1  . Years of education: 84  . Highest education level: Not on file  Occupational History  . Occupation: retired  Engineer, production  . Financial resource strain: Not on file  . Food insecurity:    Worry: Not on file    Inability: Not on file  . Transportation needs:    Medical: Not on file    Non-medical: Not on file  Tobacco Use  . Smoking status: Former Games developer  . Smokeless tobacco: Never Used  Substance and Sexual Activity  . Alcohol use: Yes    Alcohol/week: 1.8 oz    Types: 3 Cans of beer per week    Comment: 2-4 beers on weekends  . Drug use: No  . Sexual activity: Yes  Birth control/protection: Post-menopausal  Lifestyle  . Physical activity:    Days per week: Not on file    Minutes per session: Not on file  . Stress: Not on file  Relationships  . Social connections:    Talks on phone: Not on file    Gets together: Not on file    Attends religious service: Not on file    Active member of club or organization: Not on file    Attends meetings of clubs or organizations: Not on file    Relationship status: Not on file  . Intimate partner violence:    Fear of current or ex partner: Not on file    Emotionally abused: Not on file    Physically abused: Not on file    Forced sexual activity: Not on file   Other Topics Concern  . Not on file  Social History Narrative   Lives with husband. She has 1 Daughter.    Retired. Librarian  at a school.    Wears her seatbelt.    Smoke detector int he home.    Wear sunscreen.    Takes a vitamin.    Ambulated independently.    Feels safe in relationships      Allergies as of 09/18/2017   No Known Allergies     Medication List        Accurate as of 09/18/17  8:45 AM. Always use your most recent med list.          aspirin 81 MG tablet Take 81 mg by mouth daily.   atorvastatin 10 MG tablet Commonly known as:  LIPITOR TAKE 1 TABLET DAILY   calcium carbonate 600 MG Tabs tablet Commonly known as:  OS-CAL Take 600 mg by mouth daily with breakfast. With vitamin D   METAMUCIL PO Take by mouth. 1 tsp per day   OMEGA 3 PO Take 2,000 Units by mouth.   OVER THE COUNTER MEDICATION Multi mature multivitamin   VENTOLIN HFA 108 (90 Base) MCG/ACT inhaler Generic drug:  albuterol 1-2 inhalations every 4-6 hours as needed for wheezing. Dispense spacer as needed.   Vitamin D3 1000 units Caps Take 2,000 Units by mouth. Mon Wed Fri   XIIDRA 5 % Soln Generic drug:  Lifitegrast   Zoster Vaccine Adjuvanted injection Commonly known as:  SHINGRIX Inject 0.5 mLs into the muscle once for 1 dose. Repeat dose in 2-6 months once       All past medical history, surgical history, allergies, family history, immunizations andmedications were updated in the EMR today and reviewed under the history and medication portions of their EMR.     No results found for this or any previous visit (from the past 2160 hour(s)).  Dg Bone Density  Result Date: 09/30/2015 EXAM: DUAL X-RAY ABSORPTIOMETRY (DXA) FOR BONE MINERAL DENSITY IMPRESSION: Referring Physician:  RENEE A KUNEFF PATIENT: Name: Sheryl Morrison Patient ID: [id number] Birth Date: April 16, 1956 Height: 66.5 in. Sex: Female Measured: 09/30/2015 Weight: 138.0 lbs. Indications: Caucasian, Estrogen Deficient,  Height Loss (781.91), Postmenopausal Fractures: None Treatments: Calcium (E943.0), Vitamin D (E933.5) ASSESSMENT: The BMD measured at Femur Neck Right is 1.035 g/cm2 with a T-score of 0.0. This patient is considered NORMAL according to World Health Organization [facility](WHO) criteria. Site Region Measured Date Measured Age YA BMD Significant CHANGE T-score DualFemur Neck Right 09/30/2015    61.2         0.0     1.035 g/cm2 AP Spine  L1-L4      8/30/2015  61.2         1.0     1.319 g/cm2 World Health Organization [facility]) criteria for post-menopausal, Caucasian Women: Normal       T-score at or above -1 SD Osteopenia   T-score between -1 and -2.5 SD Osteoporosis T-score at or below -2.5 SD RECOMMENDATION: National Osteoporosis Foundation recommends that FDA-approved medical therapies be considered in postmenopausal women and men age 17 or older with a: 1. Hip or vertebral (clinical or morphometric) fracture. 2. T-score of <-2.5 at the spine or hip. 3. Ten-year fracture probability by FRAX of 3% or greater for hip fracture or 20% or greater for major osteoporotic fracture. All treatment decisions require clinical judgment and consideration of individual patient factors, including patient preferences, co-morbidities, previous drug use, risk factors not captured in the FRAX model (e.g. falls, vitamin D deficiency, increased bone turnover, interval significant decline in bone density) and possible under - or over-estimation of fracture risk by FRAX. All patients should ensure an adequate intake of dietary calcium (1200 mg/d) and vitamin D (800 IU daily) unless contraindicated. FOLLOW-UP: People with diagnosed cases of osteoporosis or at high risk for fracture should have regular bone mineral density tests. For patients eligible for Medicare, routine testing is allowed once every 2 years. The testing frequency can be increased to one year for patients who have rapidly progressing disease, those who are receiving or discontinuing  medical therapy to restore bone mass, or have additional risk factors. I have reviewed this report, and agree with the above findings. Mark A. Tyron Russell, M.D. [facility] Electronically Signed   By: Ulyses Southward M.D.   On: 09/30/2015 10:00   Mm Digital Screening Bilateral  Result Date: 09/30/2015 CLINICAL DATA:  Screening. EXAM: DIGITAL SCREENING BILATERAL MAMMOGRAM WITH CAD COMPARISON:  Previous exam(s). ACR Breast Density Category b: There are scattered areas of fibroglandular density. FINDINGS: There are no findings suspicious for malignancy. Images were processed with CAD. IMPRESSION: No mammographic evidence of malignancy. A result letter of this screening mammogram will be mailed directly to the patient. RECOMMENDATION: Screening mammogram in one year. (Code:SM-B-01Y) BI-RADS CATEGORY  1: Negative. Electronically Signed   By: Rolla Plate M.D.   On: 09/30/2015 12:43     ROS: 14 pt review of systems performed and negative (unless mentioned in an HPI)  Objective: BP 121/82 (BP Location: Right Arm, Patient Position: Sitting, Cuff Size: Normal)   Pulse 66   Temp 98.1 F (36.7 C)   Resp 20   Ht 5\' 8"  (1.727 m)   Wt 147 lb (66.7 kg)   SpO2 100%   BMI 22.35 kg/m  Gen: Afebrile. No acute distress. Nontoxic in appearance, well-developed, well-nourished,  Pleasant caucasian female HENT: AT. [place]. Bilateral TM visualized and normal in appearance (left TM with mild fullness), normal external auditory canal. MMM, no oral lesions, adequate dentition. Bilateral nares within normal limits. Throat without erythema, ulcerations or exudates. no Cough on exam, no hoarseness on exam. Eyes:Pupils Equal Round Reactive to light, Extraocular movements intact,  Conjunctiva without redness, discharge or icterus. Neck/lymp/endocrine: Supple, left submandibular tender lymphadenopathy, no thyromegaly CV: RRR no murmur, no edema, +2/4 P posterior tibialis pulses. no carotid bruits. No JVD. Chest: CTAB, no  wheeze, rhonchi or crackles. normal Respiratory effort. good Air movement. Abd: Soft. flat. NTND. BS present. no Masses palpated. No hepatosplenomegaly. No rebound tenderness or guarding. Skin: no rashes, purpura or petechiae. Warm and well-perfused. Skin intact. Neuro/Msk:  Normal gait. PERLA. EOMi. Alert. Oriented x3.  Cranial nerves II through XII intact. Muscle strength 5/5 upper/lower extremity. DTRs equal bilaterally. Psych: Normal affect, dress and demeanor. Normal speech. Normal thought content and judgment.   No exam data present  Assessment/plan: Saryah Loper is a 62 y.o. female present for CPE. Encounter for preventive health examination Patient was encouraged to exercise greater than 150 minutes a week. Patient was encouraged to choose a diet filled with fresh fruits and vegetables, and lean meats. AVS provided to patient today for education/recommendation on gender specific health and safety maintenance. Colonoscopy: 2015, normal (diverticulosis only), no FHX colon cancer. 10 year f/u Dr. Rhea Morrison.  Mammogram: 09/2015; normal birads 1, no fhx. Would like to have every 2 years. SBE encouraged. Cervical cancer screening: 2017 PAP, normal PAP with HPV co-test negative. Immunizations: zostavax 2017, printed shingrix (she will check w/ insurance). Tdap 2014 UTD, Flu  2017 (encouraged yearly) Infectious disease screening: HIV and  Hep C completed DEXA: 09/2015- normal  Vitamin D deficiency - Vitamin D (25 hydroxy) On statin therapy - Lipid pane Other hyperlipidemia - Lipid panel - TSH - CMP Screening for diabetes mellitus - HgB A1c Screening for iron deficiency anemia - CBC w/Diff Breast cancer screening - MM 3D SCREEN BREAST BILATERAL; Future Lymphadenopathy:  - left submandibular lymph node present, with mild fullness left TM- no erythema.  - start flonase and abx (provided by another provider)--> if does not resolve in 4 weeks, follow up for further eval.   Return in about  1 year (around 09/19/2018) for CPE.  Electronically signed by: Felix Pacini, DO [facility] [place]

## 2017-09-18 NOTE — Patient Instructions (Signed)

## 2017-09-19 ENCOUNTER — Other Ambulatory Visit: Payer: Self-pay | Admitting: Family Medicine

## 2017-09-19 MED ORDER — ATORVASTATIN CALCIUM 10 MG PO TABS: 10.0000 mg | ORAL_TABLET | Freq: Every day | ORAL | 3 refills | Status: DC

## 2017-09-19 NOTE — Telephone Encounter (Signed)
Left detailed message with results and instructions on patient voice mail per DPR 

## 2017-09-19 NOTE — Telephone Encounter (Signed)
Please inform patient the following information: All her labs look great. Atorvastatin refill pended. 2 pharmacies listed. Please send to her pharmacy of choice.

## 2017-10-23 ENCOUNTER — Ambulatory Visit
Admission: RE | Admit: 2017-10-23 | Discharge: 2017-10-23 | Disposition: A | Discharge status: Home / Self Care | Payer: BLUE CROSS/BLUE SHIELD | Source: Ambulatory Visit | Attending: Family Medicine | Admitting: Family Medicine

## 2017-10-23 DIAGNOSIS — Z1231 Encounter for screening mammogram for malignant neoplasm of breast: Secondary | ICD-10-CM | POA: Diagnosis not present

## 2017-10-23 DIAGNOSIS — Z1239 Encounter for other screening for malignant neoplasm of breast: Secondary | ICD-10-CM

## 2018-02-12 ENCOUNTER — Encounter: Payer: Self-pay | Admitting: Family Medicine

## 2018-09-20 ENCOUNTER — Encounter: Payer: BLUE CROSS/BLUE SHIELD | Admitting: Family Medicine

## 2018-11-04 ENCOUNTER — Other Ambulatory Visit: Payer: Self-pay | Admitting: Family Medicine

## 2019-01-23 ENCOUNTER — Encounter: Payer: Self-pay | Admitting: Family Medicine

## 2019-01-23 ENCOUNTER — Other Ambulatory Visit: Payer: Self-pay

## 2019-01-23 ENCOUNTER — Ambulatory Visit (INDEPENDENT_AMBULATORY_CARE_PROVIDER_SITE_OTHER): Payer: BC Managed Care – PPO | Admitting: Family Medicine

## 2019-01-23 VITALS — BP 99/69 | HR 75 | Temp 97.3°F | Resp 16 | Ht 67.25 in | Wt 144.5 lb

## 2019-01-23 DIAGNOSIS — E7849 Other hyperlipidemia: Secondary | ICD-10-CM

## 2019-01-23 DIAGNOSIS — Z0000 Encounter for general adult medical examination without abnormal findings: Secondary | ICD-10-CM | POA: Diagnosis not present

## 2019-01-23 DIAGNOSIS — E559 Vitamin D deficiency, unspecified: Secondary | ICD-10-CM | POA: Diagnosis not present

## 2019-01-23 DIAGNOSIS — Z131 Encounter for screening for diabetes mellitus: Secondary | ICD-10-CM | POA: Diagnosis not present

## 2019-01-23 DIAGNOSIS — Z79899 Other long term (current) drug therapy: Secondary | ICD-10-CM | POA: Diagnosis not present

## 2019-01-23 DIAGNOSIS — Z23 Encounter for immunization: Secondary | ICD-10-CM | POA: Diagnosis not present

## 2019-01-23 DIAGNOSIS — G479 Sleep disorder, unspecified: Secondary | ICD-10-CM | POA: Diagnosis not present

## 2019-01-23 LAB — COMPREHENSIVE METABOLIC PANEL
ALT: 16 U/L (ref 0–35)
AST: 20 U/L (ref 0–37)
Albumin: 4.6 g/dL (ref 3.5–5.2)
Alkaline Phosphatase: 76 U/L (ref 39–117)
BUN: 17 mg/dL (ref 6–23)
CO2: 30 mEq/L (ref 19–32)
Calcium: 10.2 mg/dL (ref 8.4–10.5)
Chloride: 103 mEq/L (ref 96–112)
Creatinine, Ser: 0.8 mg/dL (ref 0.40–1.20)
GFR: 72.09 mL/min (ref >60.00)
Glucose, Bld: 93 mg/dL (ref 70–99)
Potassium: 4.6 mEq/L (ref 3.5–5.1)
Sodium: 140 mEq/L (ref 135–145)
Total Bilirubin: 0.9 mg/dL (ref 0.2–1.2)
Total Protein: 7.4 g/dL (ref 6.0–8.3)

## 2019-01-23 LAB — LIPID PANEL
Cholesterol: 185 mg/dL (ref 0–200)
HDL: 80.9 mg/dL (ref >39.00)
LDL Cholesterol: 94 mg/dL (ref 0–99)
NonHDL: 103.68
Total CHOL/HDL Ratio: 2
Triglycerides: 50 mg/dL (ref 0.0–149.0)
VLDL: 10 mg/dL (ref 0.0–40.0)

## 2019-01-23 LAB — CBC
HCT: 39.1 % (ref 36.0–46.0)
Hemoglobin: 13.1 g/dL (ref 12.0–15.0)
MCHC: 33.4 g/dL (ref 30.0–36.0)
MCV: 88.8 fl (ref 78.0–100.0)
Platelets: 289 10*3/uL (ref 150.0–400.0)
RBC: 4.4 Mil/uL (ref 3.87–5.11)
RDW: 13.5 % (ref 11.5–15.5)
WBC: 4.4 10*3/uL (ref 4.0–10.5)

## 2019-01-23 LAB — TSH: TSH: 2.22 u[IU]/mL (ref 0.35–4.50)

## 2019-01-23 LAB — VITAMIN D 25 HYDROXY (VIT D DEFICIENCY, FRACTURES): VITD: 71.4 ng/mL (ref 30.00–100.00)

## 2019-01-23 LAB — HEMOGLOBIN A1C: Hgb A1c MFr Bld: 5.8 % (ref 4.6–6.5)

## 2019-01-23 MED ORDER — HYDROXYZINE HCL 10 MG PO TABS: 10.0000 mg | ORAL_TABLET | Freq: Every day | ORAL | 2 refills | Status: AC

## 2019-01-23 NOTE — Patient Instructions (Signed)
Health Maintenance, Female Adopting a healthy lifestyle and getting preventive care are important in promoting health and wellness. Ask your health care provider about:  The right schedule for you to have regular tests and exams.  Things you can do on your own to prevent diseases and keep yourself healthy. What should I know about diet, weight, and exercise? Eat a healthy diet   Eat a diet that includes plenty of vegetables, fruits, low-fat dairy products, and lean protein.  Do not eat a lot of foods that are high in solid fats, added sugars, or sodium. Maintain a healthy weight Body mass index (BMI) is used to identify weight problems. It estimates body fat based on height and weight. Your health care provider can help determine your BMI and help you achieve or maintain a healthy weight. Get regular exercise Get regular exercise. This is one of the most important things you can do for your health. Most adults should:  Exercise for at least 150 minutes each week. The exercise should increase your heart rate and make you sweat (moderate-intensity exercise).  Do strengthening exercises at least twice a week. This is in addition to the moderate-intensity exercise.  Spend less time sitting. Even light physical activity can be beneficial. Watch cholesterol and blood lipids Have your blood tested for lipids and cholesterol at 64 years of age, then have this test every 5 years. Have your cholesterol levels checked more often if:  Your lipid or cholesterol levels are high.  You are older than 64 years of age.  You are at high risk for heart disease. What should I know about cancer screening? Depending on your health history and family history, you may need to have cancer screening at various ages. This may include screening for:  Breast cancer.  Cervical cancer.  Colorectal cancer.  Skin cancer.  Lung cancer. What should I know about heart disease, diabetes, and high blood  pressure? Blood pressure and heart disease  High blood pressure causes heart disease and increases the risk of stroke. This is more likely to develop in people who have high blood pressure readings, are of African descent, or are overweight.  Have your blood pressure checked: ? Every 3-5 years if you are 18-39 years of age. ? Every year if you are 40 years old or older. Diabetes Have regular diabetes screenings. This checks your fasting blood sugar level. Have the screening done:  Once every three years after age 40 if you are at a normal weight and have a low risk for diabetes.  More often and at a younger age if you are overweight or have a high risk for diabetes. What should I know about preventing infection? Hepatitis B If you have a higher risk for hepatitis B, you should be screened for this virus. Talk with your health care provider to find out if you are at risk for hepatitis B infection. Hepatitis C Testing is recommended for:  Everyone born from 1945 through 1965.  Anyone with known risk factors for hepatitis C. Sexually transmitted infections (STIs)  Get screened for STIs, including gonorrhea and chlamydia, if: ? You are sexually active and are younger than 64 years of age. ? You are older than 64 years of age and your health care provider tells you that you are at risk for this type of infection. ? Your sexual activity has changed since you were last screened, and you are at increased risk for chlamydia or gonorrhea. Ask your health care provider if   you are at risk.  Ask your health care provider about whether you are at high risk for HIV. Your health care provider may recommend a prescription medicine to help prevent HIV infection. If you choose to take medicine to prevent HIV, you should first get tested for HIV. You should then be tested every 3 months for as long as you are taking the medicine. Pregnancy  If you are about to stop having your period (premenopausal) and  you may become pregnant, seek counseling before you get pregnant.  Take 400 to 800 micrograms (mcg) of folic acid every day if you become pregnant.  Ask for birth control (contraception) if you want to prevent pregnancy. Osteoporosis and menopause Osteoporosis is a disease in which the bones lose minerals and strength with aging. This can result in bone fractures. If you are 65 years old or older, or if you are at risk for osteoporosis and fractures, ask your health care provider if you should:  Be screened for bone loss.  Take a calcium or vitamin D supplement to lower your risk of fractures.  Be given hormone replacement therapy (HRT) to treat symptoms of menopause. Follow these instructions at home: Lifestyle  Do not use any products that contain nicotine or tobacco, such as cigarettes, e-cigarettes, and chewing tobacco. If you need help quitting, ask your health care provider.  Do not use street drugs.  Do not share needles.  Ask your health care provider for help if you need support or information about quitting drugs. Alcohol use  Do not drink alcohol if: ? Your health care provider tells you not to drink. ? You are pregnant, may be pregnant, or are planning to become pregnant.  If you drink alcohol: ? Limit how much you use to 0-1 drink a day. ? Limit intake if you are breastfeeding.  Be aware of how much alcohol is in your drink. In the [place] one drink equals one 12 oz bottle of beer (355 mL), one 5 oz glass of wine (148 mL), or one 1 oz glass of hard liquor (44 mL). General instructions  Schedule regular health, dental, and eye exams.  Stay current with your vaccines.  Tell your health care provider if: ? You often feel depressed. ? You have ever been abused or do not feel safe at home. Summary  Adopting a healthy lifestyle and getting preventive care are important in promoting health and wellness.  Follow your health care provider's instructions about healthy  diet, exercising, and getting tested or screened for diseases.  Follow your health care provider's instructions on monitoring your cholesterol and blood pressure. This information is not intended to replace advice given to you by your health care provider. Make sure you discuss any questions you have with your health care provider. Document Released: 10/18/2010 Document Revised: 03/28/2018 Document Reviewed: 03/28/2018 Elsevier Patient Education  2020 [employer]  

## 2019-01-23 NOTE — Progress Notes (Signed)
Patient ID: Sheryl Morrison, female  DOB: July 16, 1955, 63 y.o.   MRN: [id number] Patient Care Team    Relationship Specialty Notifications Start End  Ma Hillock, DO PCP - General Family Medicine  09/15/15   Pyrtle, Lajuan Lines, MD Consulting Physician Gastroenterology  09/15/15     Chief Complaint  Patient presents with  . Annual Exam    Fasting. Pap smear 2017. Mammogram 10/2017.     Subjective:  Sheryl Morrison is a 63 y.o.  Female  present for CPE. All past medical history, surgical history, allergies, family history, immunizations, medications and social history were updated in the electronic medical record today. All recent labs, ED visits and hospitalizations within the last year were reviewed.  Sleep disturbance: Pt reports she is having more difficulty getting off to sleep the last few months. She endorses some increase in her anxiety, she believes 2/2 to the pandemic. She has tried OTC sleep aids that give her a sleep hangover or do not work well. Melatonin gave her restless leg feeling. She states she feels very tired. She has a routine sleep schedule and goes to be at about 9:45 pm, but sometimes can not fall asleep until 2 am because she is laying in bed and her mind will not shut down.   Health maintenance: updated 01/23/19 Colonoscopy: 2015, normal (diverticulosis only), no FHX colon cancer.10 year f/u Dr. Hilarie Fredrickson. Mammogram: 10/2017; normal birads 1, no fhx.Would like to have every 2 years.SBE encouraged. Cervical cancer screening: 2017PAP, normal PAP with HPV co-test negative. 5 yr or 48- if normal then, no additional screening needed. . Immunizations: [email or web address], shingrix series Completed 2019. Tdap 2014 UTD, Flu  provided today(encouraged yearly). PNA series after 65 to be started Infectious disease screening: HIVandHep C completed DEXA:09/2015- normal Assistive device: none Oxygen WIO:XBDZ Patient has a Dental home. Hospitalizations/ED visits: reviewed    Depression screen [facility] 2/9 01/23/2019 09/18/2017 09/15/2016 09/15/2015  Decreased Interest 0 0 0 0  Down, Depressed, Hopeless 0 0 0 0  PHQ - 2 Score 0 0 0 0   No flowsheet data found.   Immunization History  Administered Date(s) Administered  . Influenza,inj,Quad PF,6+ Mos 03/18/2016, 03/03/2017, 02/12/2018, 01/23/2019  . Tdap 09/12/2012  . Zoster 09/17/2015  . Zoster Recombinat (Shingrix) 01/22/2018, 03/28/2018    Past Medical History:  Diagnosis Date  . Anemia   . Chicken pox   . Urinary tract bacterial infections    No Known Allergies Past Surgical History:  Procedure Laterality Date  . APPENDECTOMY  1963   Family History  Problem Relation Age of Onset  . Heart disease Mother   . Hypertension Mother   . Obesity Mother   . Heart disease Father   . Hypertension Father   . Alcohol abuse Sister   . Depression Daughter   . Obesity Daughter   . Alzheimer's disease Paternal Uncle   . Colon cancer Neg Hx   . Pancreatic cancer Neg Hx   . Rectal cancer Neg Hx   . Stomach cancer Neg Hx   . Breast cancer Neg Hx    Social History   Social History Narrative   Lives with husband. She has 1 Daughter.    Retired. Librarian  at a school.    Wears her seatbelt.    Smoke detector int he home.    Wear sunscreen.    Takes a vitamin.    Ambulated independently.    Feels safe in relationships  Allergies as of 01/23/2019   No Known Allergies     Medication List       Accurate as of January 23, 2019  9:06 AM. If you have any questions, ask your nurse or doctor.        STOP taking these medications   Ventolin HFA 108 (90 Base) MCG/ACT inhaler Generic drug: albuterol Stopped by: Felix Pacini, DO     TAKE these medications   aspirin 81 MG tablet Take 81 mg by mouth daily.   atorvastatin 10 MG tablet Commonly known as: LIPITOR TAKE 1 TABLET DAILY   calcium carbonate 600 MG Tabs tablet Commonly known as: OS-CAL Take 600 mg by mouth daily with breakfast. With  vitamin D   METAMUCIL PO Take by mouth. 1 tsp per day   OMEGA 3 PO Take 2,000 Units by mouth.   OVER THE COUNTER MEDICATION Multi mature multivitamin   Vitamin D3 25 MCG (1000 UT) Caps Take 2,000 Units by mouth. Mon Wed Fri   Xiidra 5 % Soln Generic drug: Lifitegrast       All past medical history, surgical history, allergies, family history, immunizations andmedications were updated in the EMR today and reviewed under the history and medication portions of their EMR.     No results found for this or any previous visit (from the past 2160 hour(s)).  Mm 3d Screen Breast Bilateral result Date: 10/23/2017  BI-RADS CATEGORY  1: Negative. Electronically Signed   By: Ted Mcalpine M.D.   On: 10/23/2017 17:14    ROS: 14 pt review of systems performed and negative (unless mentioned in an HPI)  Objective: BP 99/69 (BP Location: Left Arm, Patient Position: Sitting, Cuff Size: Normal)   Pulse 75   Temp (!) 97.3 F (36.3 C) (Temporal)   Resp 16   Ht 5' 7.25" (1.708 m)   Wt 144 lb 8 oz (65.5 kg)   SpO2 100%   BMI 22.46 kg/m  Gen: Afebrile. No acute distress. Nontoxic in appearance, well-developed, well-nourished,  Pleasant caucasian female.  HENT: AT. [place]. Bilateral TM visualized and normal in appearance, normal external auditory canal. MMM, no oral lesions, adequate dentition. Bilateral nares within normal limits. Throat without erythema, ulcerations or exudates. no Cough on exam, no hoarseness on exam. Eyes:Pupils Equal Round Reactive to light, Extraocular movements intact,  Conjunctiva without redness, discharge or icterus. Neck/lymp/endocrine: Supple,no lymphadenopathy, no thyromegaly CV: RRR no murmur, no edema, +2/4 P posterior tibialis pulses. no carotid bruits. No JVD. Chest: CTAB, no wheeze, rhonchi or crackles. normal Respiratory effort. good Air movement. Abd: Soft. flat. NTND. BS present. no Masses palpated. No hepatosplenomegaly. No rebound tenderness or guarding.  Skin: no rashes, purpura or petechiae. Warm and well-perfused. Skin intact. Neuro/Msk: Normal gait. PERLA. EOMi. Alert. Oriented x3.  Cranial nerves II through XII intact. Muscle strength 5/5 upper/lower extremity. DTRs equal bilaterally. Psych: Normal affect, dress and demeanor. Normal speech. Normal thought content and judgment.  No exam data present  Assessment/plan: Sheryl Morrison is a 63 y.o. female present for CPE Need for influenza vaccination - Flu Vaccine QUAD 36+ mos IM  hyperlipidemia/On statin therapy - continue atorvastatin 10 mg qd>> refills provided  - conitnue  fish oil 2000 u daily, fiber supplement - continue ASA  - CBC - Comprehensive metabolic panel - Lipid panel - TSH Vitamin D deficiency - supplementing with 2000u qd -  Vitamin D (25 hydroxy) Diabetes mellitus screening - Hemoglobin A1c Sleep disturbance:  - new problem.  - difficulty falling  asleep. She has tried OTC products, pills, teas etc.  - discussed options with her and she would like to try low dose vistaril 10-20 mg QHS.  - sent to local pharmacy, if working well for her ok to send 90 d with 3 refills to mail in pharmacy if she desires.  Encounter for preventive health examination Patient was encouraged to exercise greater than 150 minutes a week. Patient was encouraged to choose a diet filled with fresh fruits and vegetables, and lean meats. AVS provided to patient today for education/recommendation on gender specific health and safety maintenance. Colonoscopy: 2015, normal (diverticulosis only), no FHX colon cancer.10 year f/u Dr. Rhea BeltonPyrtle. Mammogram: 10/2017; normal birads 1, no fhx.Would like to have every 2 years.SBE encouraged. Cervical cancer screening: 2017PAP, normal PAP with HPV co-test negative. 5 yr or 3265- if normal then, no additional screening needed. . Immunizations: [email or web address], shingrix series Completed 2019. Tdap 2014 UTD, Flu  provided today(encouraged yearly). PNA series after 65  to be started Infectious disease screening: HIVandHep C completed DEXA:09/2015- normal  Return in about 1 year (around 01/23/2020) for CPE (30 min).  Annual physical completed today, as well as > 10 minutes spent with patient discussing and treating new problem.   Electronically signed by: Felix Pacinienee Janalyn Higby, DO [facility]

## 2019-01-29 ENCOUNTER — Telehealth: Payer: Self-pay | Admitting: Family Medicine

## 2019-01-29 DIAGNOSIS — L989 Disorder of the skin and subcutaneous tissue, unspecified: Secondary | ICD-10-CM

## 2019-01-29 NOTE — Telephone Encounter (Signed)
-----   Message from Caroll Rancher, LPN sent at 44/12/7528 11:05 AM EDT ----- Pt was called and given lab results. Pt states she forgot to mention at CPE that last year at her CPE Dr Raoul Pitch pointed out a spot on her ear that is just darker in color. Pt declined referral at that time but would like one now to derm. Please advise.

## 2019-01-29 NOTE — Telephone Encounter (Signed)
Pt was called and given information.  

## 2019-01-29 NOTE — Telephone Encounter (Signed)
Referral placed to derm. Please let pt know

## 2019-02-28 DIAGNOSIS — L814 Other melanin hyperpigmentation: Secondary | ICD-10-CM | POA: Diagnosis not present

## 2019-02-28 DIAGNOSIS — L570 Actinic keratosis: Secondary | ICD-10-CM | POA: Diagnosis not present

## 2019-02-28 DIAGNOSIS — D225 Melanocytic nevi of trunk: Secondary | ICD-10-CM | POA: Diagnosis not present

## 2019-05-03 DIAGNOSIS — L570 Actinic keratosis: Secondary | ICD-10-CM | POA: Diagnosis not present

## 2019-05-08 DIAGNOSIS — H16223 Keratoconjunctivitis sicca, not specified as Sjogren's, bilateral: Secondary | ICD-10-CM | POA: Diagnosis not present

## 2019-08-14 ENCOUNTER — Other Ambulatory Visit: Payer: Self-pay | Admitting: Family Medicine

## 2019-08-14 DIAGNOSIS — Z1231 Encounter for screening mammogram for malignant neoplasm of breast: Secondary | ICD-10-CM

## 2019-09-10 ENCOUNTER — Other Ambulatory Visit: Payer: Self-pay

## 2019-09-10 ENCOUNTER — Ambulatory Visit
Admission: RE | Admit: 2019-09-10 | Discharge: 2019-09-10 | Disposition: A | Discharge status: Home / Self Care | Payer: BC Managed Care – PPO | Source: Ambulatory Visit

## 2019-09-10 DIAGNOSIS — Z1231 Encounter for screening mammogram for malignant neoplasm of breast: Secondary | ICD-10-CM

## 2019-10-30 ENCOUNTER — Other Ambulatory Visit: Payer: Self-pay | Admitting: Family Medicine

## 2020-01-28 ENCOUNTER — Other Ambulatory Visit: Payer: Self-pay | Admitting: Family Medicine

## 2021-11-05 IMAGING — MG DIGITAL SCREENING BILAT W/ TOMO W/ CAD
6 of 12 series · 6 of 36 positions shown · non-contrast
Comparison: Previous exam(s).

CLINICAL DATA: Screening.

EXAM:
DIGITAL SCREENING BILATERAL MAMMOGRAM WITH TOMO AND CAD

[R CC synth-2D]
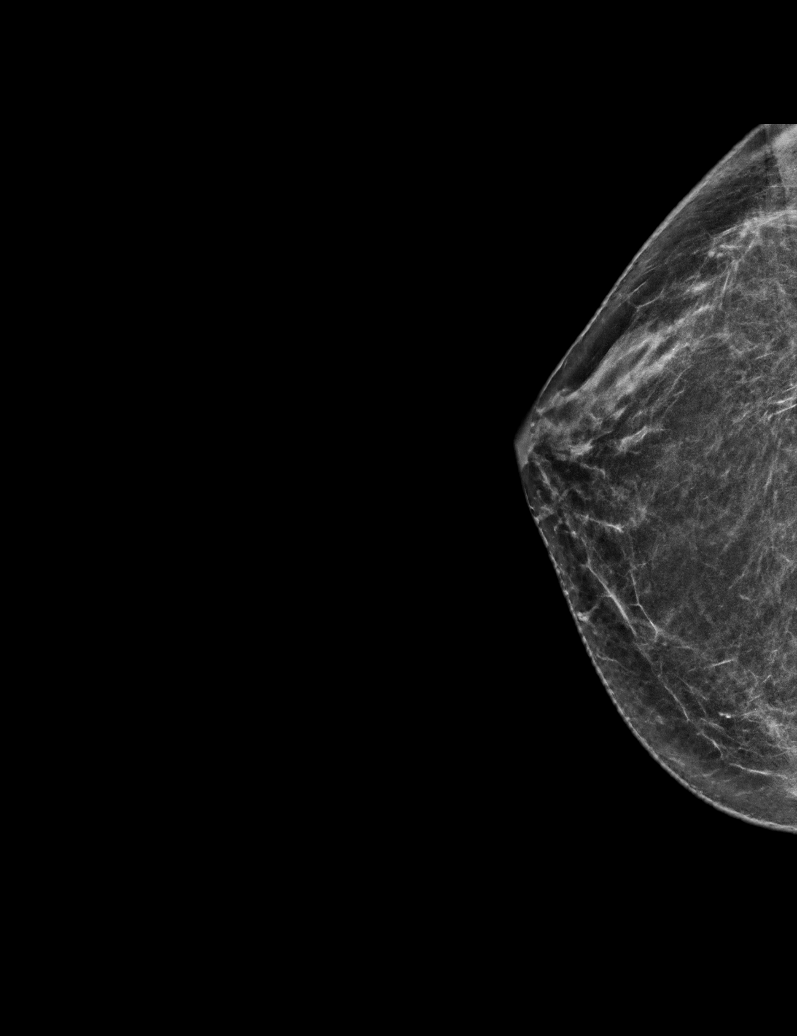

[L MLO synth-2D (1 of 2)]
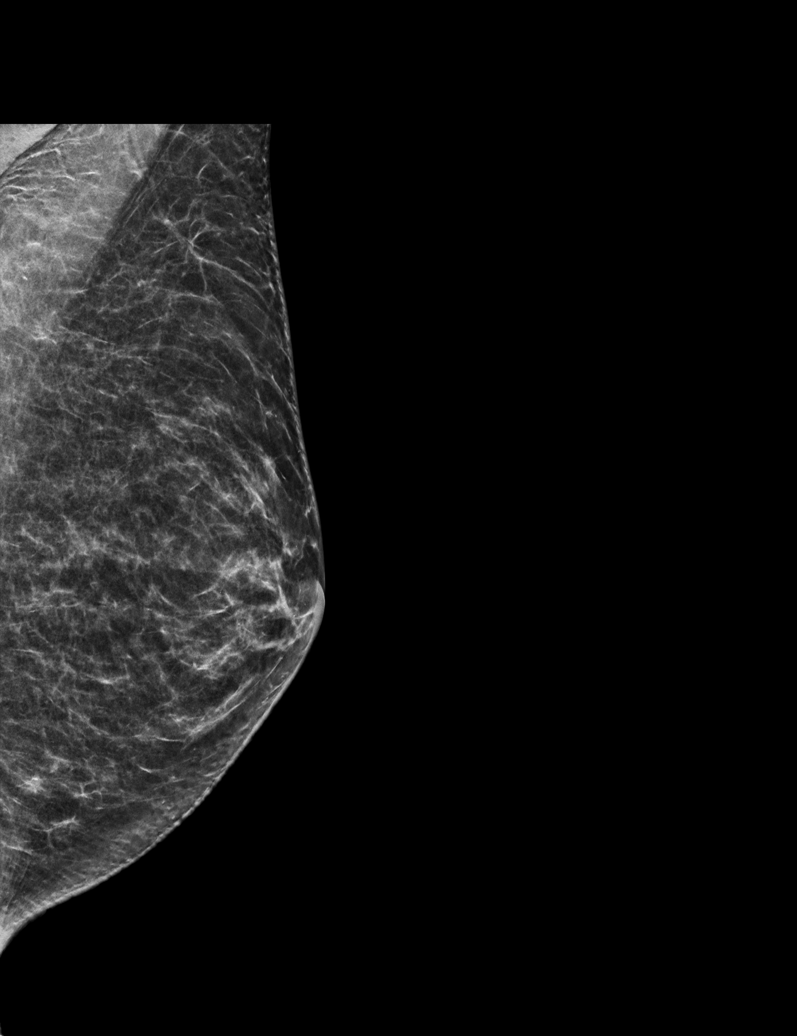

[R MLO synth-2D (1 of 2)]
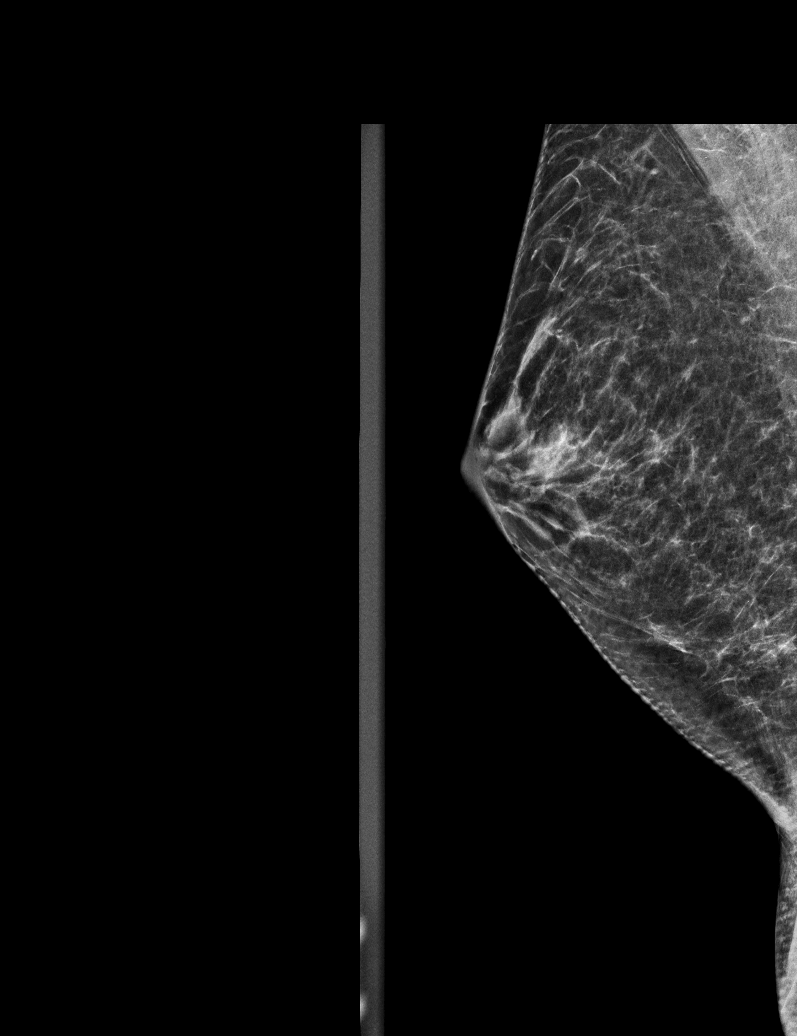

[R MLO synth-2D (2 of 2)]
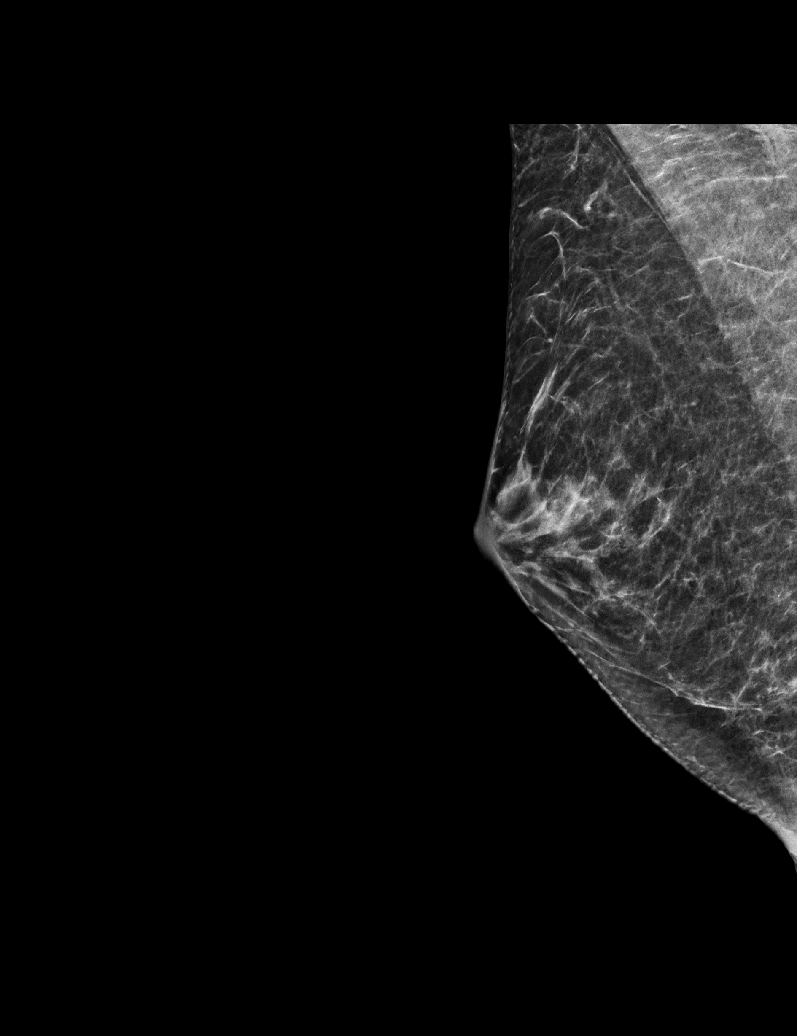

[L CC synth-2D]
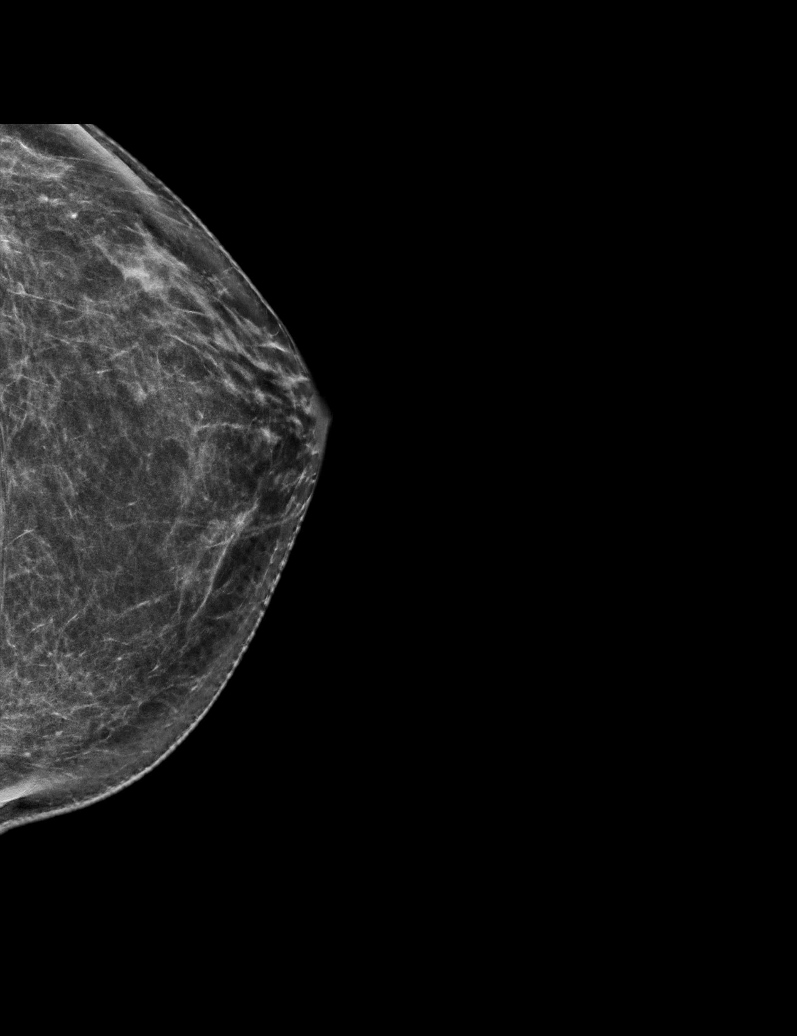

[L MLO synth-2D (2 of 2)]
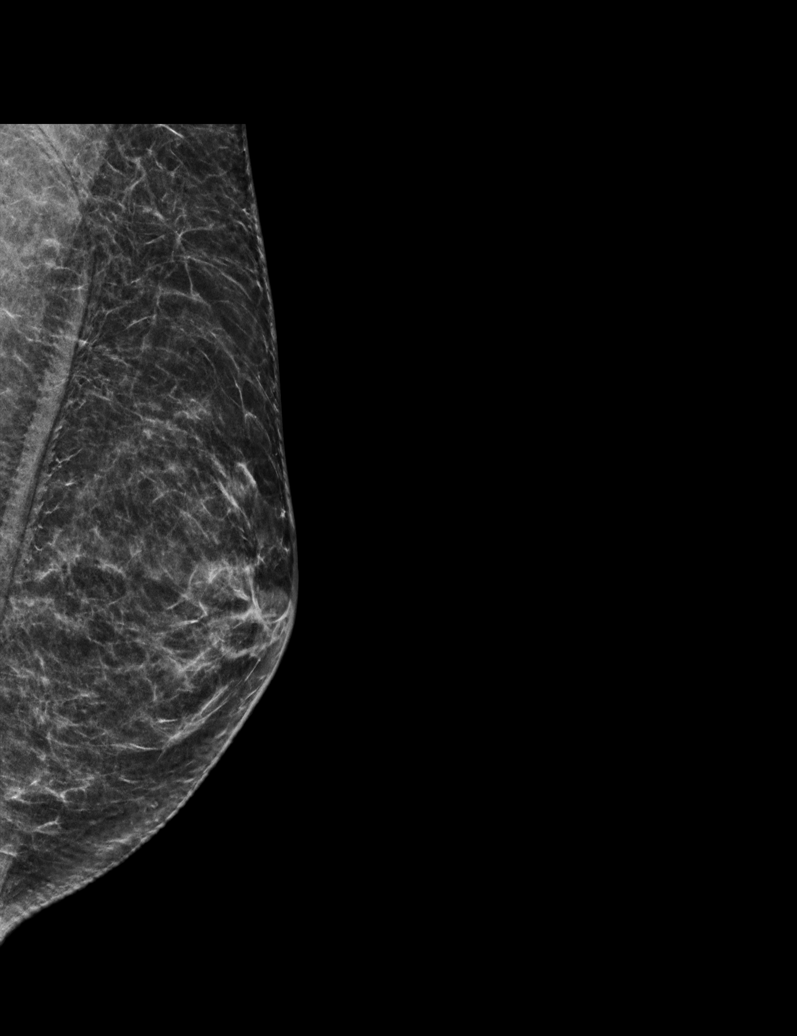

[6 of 36 positions shown; findings below may reference images not displayed]

ACR Breast Density Category b: There are scattered areas of
fibroglandular density.
FINDINGS: There are no findings suspicious for malignancy. Images were
processed with CAD.
IMPRESSION: No mammographic evidence of malignancy. A result letter of this
screening mammogram will be mailed directly to the patient.

RECOMMENDATION:
Screening mammogram in one year. (Code:[id number])

BI-RADS CATEGORY  1: Negative.

## 2023-12-03 LAB — COLOGUARD: COLOGUARD: NEGATIVE
